# Patient Record
Sex: Female | Born: 1935 | Race: White | Hispanic: No | Marital: Married | Smoking: Never smoker
Health system: Southern US, Community
[De-identification: ages and names within clinical notes are randomized; demographics above are authoritative.]

## PROBLEM LIST (undated history)

## (undated) DIAGNOSIS — M1990 Unspecified osteoarthritis, unspecified site: Secondary | ICD-10-CM

## (undated) DIAGNOSIS — K2100 Gastro-esophageal reflux disease with esophagitis, without bleeding: Secondary | ICD-10-CM

## (undated) DIAGNOSIS — M719 Bursopathy, unspecified: Secondary | ICD-10-CM

## (undated) DIAGNOSIS — F419 Anxiety disorder, unspecified: Secondary | ICD-10-CM

## (undated) DIAGNOSIS — J45909 Unspecified asthma, uncomplicated: Secondary | ICD-10-CM

## (undated) DIAGNOSIS — F39 Unspecified mood [affective] disorder: Secondary | ICD-10-CM

## (undated) DIAGNOSIS — E039 Hypothyroidism, unspecified: Secondary | ICD-10-CM

## (undated) DIAGNOSIS — M2550 Pain in unspecified joint: Secondary | ICD-10-CM

## (undated) DIAGNOSIS — I739 Peripheral vascular disease, unspecified: Secondary | ICD-10-CM

## (undated) DIAGNOSIS — K219 Gastro-esophageal reflux disease without esophagitis: Secondary | ICD-10-CM

## (undated) DIAGNOSIS — N189 Chronic kidney disease, unspecified: Secondary | ICD-10-CM

## (undated) DIAGNOSIS — F03918 Unspecified dementia, unspecified severity, with other behavioral disturbance: Secondary | ICD-10-CM

## (undated) DIAGNOSIS — I10 Essential (primary) hypertension: Secondary | ICD-10-CM

## (undated) DIAGNOSIS — M545 Low back pain, unspecified: Secondary | ICD-10-CM

## (undated) DIAGNOSIS — F251 Schizoaffective disorder, depressive type: Secondary | ICD-10-CM

## (undated) DIAGNOSIS — J449 Chronic obstructive pulmonary disease, unspecified: Secondary | ICD-10-CM

## (undated) DIAGNOSIS — F329 Major depressive disorder, single episode, unspecified: Secondary | ICD-10-CM

## (undated) DIAGNOSIS — E785 Hyperlipidemia, unspecified: Secondary | ICD-10-CM

## (undated) DIAGNOSIS — F3289 Other specified depressive episodes: Secondary | ICD-10-CM

## (undated) DIAGNOSIS — E559 Vitamin D deficiency, unspecified: Secondary | ICD-10-CM

## (undated) HISTORY — DX: Pain in unspecified joint: M25.50

## (undated) HISTORY — DX: Vitamin D deficiency, unspecified: E55.9

## (undated) HISTORY — DX: Morbid (severe) obesity due to excess calories: E66.01

## (undated) HISTORY — PX: CHOLECYSTECTOMY: SHX55

## (undated) HISTORY — DX: Bursopathy, unspecified: M71.9

## (undated) HISTORY — DX: Low back pain, unspecified: M54.50

## (undated) HISTORY — DX: Other specified depressive episodes: F32.89

## (undated) HISTORY — DX: Unspecified osteoarthritis, unspecified site: M19.90

## (undated) HISTORY — DX: Hyperlipidemia, unspecified: E78.5

## (undated) HISTORY — DX: Hypothyroidism, unspecified: E03.9

## (undated) HISTORY — PX: BACK SURGERY: SHX140

## (undated) HISTORY — DX: Gastro-esophageal reflux disease with esophagitis, without bleeding: K21.00

## (undated) HISTORY — PX: OTHER SURGICAL HISTORY: SHX169

## (undated) HISTORY — DX: Essential (primary) hypertension: I10

## (undated) HISTORY — DX: Low back pain: M54.5

## (undated) HISTORY — PX: ABDOMINAL HYSTERECTOMY: SUR658

## (undated) HISTORY — DX: Gastro-esophageal reflux disease with esophagitis: K21.0

## (undated) HISTORY — DX: Major depressive disorder, single episode, unspecified: F32.9

---

## 2001-08-24 ENCOUNTER — Ambulatory Visit (HOSPITAL_COMMUNITY): Admission: RE | Admit: 2001-08-24 | Discharge: 2001-08-24 | Payer: Self-pay | Admitting: Pulmonary Disease

## 2002-10-11 ENCOUNTER — Ambulatory Visit (HOSPITAL_COMMUNITY): Admission: RE | Admit: 2002-10-11 | Discharge: 2002-10-11 | Payer: Self-pay | Admitting: Pulmonary Disease

## 2003-11-03 ENCOUNTER — Ambulatory Visit (HOSPITAL_COMMUNITY): Admission: RE | Admit: 2003-11-03 | Discharge: 2003-11-03 | Payer: Self-pay | Admitting: Pulmonary Disease

## 2004-01-18 ENCOUNTER — Ambulatory Visit (HOSPITAL_COMMUNITY): Admission: RE | Admit: 2004-01-18 | Discharge: 2004-01-18 | Payer: Self-pay | Admitting: Internal Medicine

## 2004-08-22 ENCOUNTER — Ambulatory Visit: Payer: Self-pay | Admitting: Orthopedic Surgery

## 2004-11-04 ENCOUNTER — Ambulatory Visit (HOSPITAL_COMMUNITY): Admission: RE | Admit: 2004-11-04 | Discharge: 2004-11-04 | Payer: Self-pay | Admitting: Pulmonary Disease

## 2005-05-06 ENCOUNTER — Ambulatory Visit (HOSPITAL_COMMUNITY): Admission: RE | Admit: 2005-05-06 | Discharge: 2005-05-06 | Payer: Self-pay | Admitting: Pulmonary Disease

## 2005-11-06 ENCOUNTER — Ambulatory Visit (HOSPITAL_COMMUNITY): Admission: RE | Admit: 2005-11-06 | Discharge: 2005-11-06 | Payer: Self-pay | Admitting: Pulmonary Disease

## 2006-11-09 ENCOUNTER — Ambulatory Visit (HOSPITAL_COMMUNITY): Admission: RE | Admit: 2006-11-09 | Discharge: 2006-11-09 | Payer: Self-pay | Admitting: Pulmonary Disease

## 2007-11-11 ENCOUNTER — Ambulatory Visit (HOSPITAL_COMMUNITY): Admission: RE | Admit: 2007-11-11 | Discharge: 2007-11-11 | Payer: Self-pay | Admitting: Pulmonary Disease

## 2008-11-13 ENCOUNTER — Ambulatory Visit (HOSPITAL_COMMUNITY): Admission: RE | Admit: 2008-11-13 | Discharge: 2008-11-13 | Payer: Self-pay | Admitting: Pulmonary Disease

## 2009-08-26 DEATH — deceased

## 2009-09-28 ENCOUNTER — Ambulatory Visit (HOSPITAL_COMMUNITY): Admission: RE | Admit: 2009-09-28 | Discharge: 2009-09-28 | Payer: Self-pay | Admitting: Pulmonary Disease

## 2009-09-28 ENCOUNTER — Ambulatory Visit: Payer: Self-pay | Admitting: Cardiology

## 2009-10-18 ENCOUNTER — Ambulatory Visit (HOSPITAL_COMMUNITY): Admission: RE | Admit: 2009-10-18 | Discharge: 2009-10-18 | Payer: Self-pay | Admitting: Pulmonary Disease

## 2009-11-15 ENCOUNTER — Ambulatory Visit (HOSPITAL_COMMUNITY): Admission: RE | Admit: 2009-11-15 | Discharge: 2009-11-15 | Payer: Self-pay | Admitting: Pulmonary Disease

## 2010-09-13 NOTE — Op Note (Signed)
NAME:  Cassie Hanson, Cassie Hanson                 ACCOUNT NO.:  [id number]   MEDICAL RECORD NO.:  [id number]          PATIENT TYPE:  AMB   LOCATION:  DAY                           FACILITY:  [facility]   PHYSICIAN:  R. Roetta Sessions, M.D. DATE OF BIRTH:  May 18, 1935   DATE OF PROCEDURE:  01/18/2004  DATE OF DISCHARGE:                                 OPERATIVE REPORT   PROCEDURE:  Screening colonoscopy.   INDICATIONS FOR PROCEDURE:  The patient is a 75 year old Caucasian female  who was sent over by the courtesy of Dr. Shaune Pollack for colonoscopy.  She  does not have any GI symptoms.  No family history of colorectal neoplasia.  She has never had a colonoscopy previously.  Colonoscopy is now being done  as a screening maneuver.  This approach has been discussed with the patient  at length.  The potential risks, benefits, and alternatives have been  reviewed and questions answered.  She is agreeable.  Please see my  handwritten H&P for more information.   PROCEDURE:  O2 saturation, blood pressure, pulses, and respirations were  monitored throughout the entirety of the procedure.  Conscious sedation was  with Versed 4 mg IV, Demerol 75 mg IV in divided doses.  The instrument used  was the Olympus video chip system.   FINDINGS:  Digital rectal examination revealed no abnormalities.   ENDOSCOPIC FINDINGS:  The prep was adequate.   Rectum:  Examination of the rectal mucosa including retroflex view of the  anal verge revealed no abnormalities.   Colon:  The colonic mucosa was surveyed from the rectosigmoid junction  through the left, transverse, right colon to the area of the appendiceal  orifice, ileocecal valve, and cecum.  These structures were well-seen and  photographed for the record.  From this level, the scope was slowly  withdrawn.  All previously mentioned mucosal surfaces were again seen.  The  colonic mucosa appeared normal.  The patient tolerated the procedure well  and was reactive in  endoscopy.   IMPRESSION:  1.  Normal rectum.  2.  Normal colon.   RECOMMENDATIONS:  Repeat screening colonoscopy in 10 years.      RMR/MEDQ  D:  01/18/2004  T:  01/18/2004  Job:  [id number]   cc:   Ramon Dredge L. Juanetta Gosling, M.D.  [address]  [place]  [place] [address]  Fax: [phone]

## 2010-10-14 ENCOUNTER — Other Ambulatory Visit (HOSPITAL_COMMUNITY): Payer: Self-pay | Admitting: Pulmonary Disease

## 2010-10-14 DIAGNOSIS — Z139 Encounter for screening, unspecified: Secondary | ICD-10-CM

## 2010-11-18 ENCOUNTER — Ambulatory Visit (HOSPITAL_COMMUNITY)
Admission: RE | Admit: 2010-11-18 | Discharge: 2010-11-18 | Disposition: A | Discharge status: Home / Self Care | Payer: Medicare Other | Source: Ambulatory Visit | Attending: Pulmonary Disease | Admitting: Pulmonary Disease

## 2010-11-18 DIAGNOSIS — Z1231 Encounter for screening mammogram for malignant neoplasm of breast: Secondary | ICD-10-CM | POA: Insufficient documentation

## 2010-11-18 DIAGNOSIS — Z139 Encounter for screening, unspecified: Secondary | ICD-10-CM

## 2011-10-13 ENCOUNTER — Other Ambulatory Visit (HOSPITAL_COMMUNITY): Payer: Self-pay | Admitting: Pulmonary Disease

## 2011-10-13 DIAGNOSIS — IMO0001 Reserved for inherently not codable concepts without codable children: Secondary | ICD-10-CM

## 2011-11-19 ENCOUNTER — Ambulatory Visit (HOSPITAL_COMMUNITY): Payer: Medicare Other

## 2011-11-20 ENCOUNTER — Ambulatory Visit (HOSPITAL_COMMUNITY)
Admission: RE | Admit: 2011-11-20 | Discharge: 2011-11-20 | Disposition: A | Discharge status: Home / Self Care | Payer: Medicare Other | Source: Ambulatory Visit | Attending: Pulmonary Disease | Admitting: Pulmonary Disease

## 2011-11-20 DIAGNOSIS — IMO0001 Reserved for inherently not codable concepts without codable children: Secondary | ICD-10-CM

## 2011-11-20 DIAGNOSIS — Z1231 Encounter for screening mammogram for malignant neoplasm of breast: Secondary | ICD-10-CM | POA: Insufficient documentation

## 2011-11-26 ENCOUNTER — Other Ambulatory Visit: Payer: Self-pay | Admitting: Pulmonary Disease

## 2011-11-26 DIAGNOSIS — R928 Other abnormal and inconclusive findings on diagnostic imaging of breast: Secondary | ICD-10-CM

## 2011-12-03 ENCOUNTER — Ambulatory Visit (HOSPITAL_COMMUNITY)
Admission: RE | Admit: 2011-12-03 | Discharge: 2011-12-03 | Disposition: A | Discharge status: Home / Self Care | Payer: Medicare Other | Source: Ambulatory Visit | Attending: Pulmonary Disease | Admitting: Pulmonary Disease

## 2011-12-03 DIAGNOSIS — R928 Other abnormal and inconclusive findings on diagnostic imaging of breast: Secondary | ICD-10-CM | POA: Insufficient documentation

## 2011-12-23 ENCOUNTER — Ambulatory Visit (HOSPITAL_COMMUNITY)
Admission: RE | Admit: 2011-12-23 | Discharge: 2011-12-23 | Disposition: A | Discharge status: Home / Self Care | Payer: Medicare Other | Source: Ambulatory Visit | Attending: Pulmonary Disease | Admitting: Pulmonary Disease

## 2011-12-23 ENCOUNTER — Other Ambulatory Visit (HOSPITAL_COMMUNITY): Payer: Self-pay | Admitting: Pulmonary Disease

## 2011-12-23 DIAGNOSIS — R52 Pain, unspecified: Secondary | ICD-10-CM

## 2011-12-23 DIAGNOSIS — M25559 Pain in unspecified hip: Secondary | ICD-10-CM | POA: Insufficient documentation

## 2012-04-01 ENCOUNTER — Other Ambulatory Visit (HOSPITAL_COMMUNITY): Payer: Self-pay | Admitting: Pulmonary Disease

## 2012-04-01 DIAGNOSIS — S0990XS Unspecified injury of head, sequela: Secondary | ICD-10-CM

## 2012-04-01 DIAGNOSIS — G44309 Post-traumatic headache, unspecified, not intractable: Secondary | ICD-10-CM

## 2012-04-02 ENCOUNTER — Ambulatory Visit (HOSPITAL_COMMUNITY)
Admission: RE | Admit: 2012-04-02 | Discharge: 2012-04-02 | Disposition: A | Discharge status: Home / Self Care | Payer: Medicare Other | Source: Ambulatory Visit | Attending: Pulmonary Disease | Admitting: Pulmonary Disease

## 2012-04-02 DIAGNOSIS — G44309 Post-traumatic headache, unspecified, not intractable: Secondary | ICD-10-CM | POA: Insufficient documentation

## 2012-04-02 DIAGNOSIS — S0990XS Unspecified injury of head, sequela: Secondary | ICD-10-CM

## 2012-04-02 DIAGNOSIS — IMO0001 Reserved for inherently not codable concepts without codable children: Secondary | ICD-10-CM | POA: Insufficient documentation

## 2012-10-26 ENCOUNTER — Other Ambulatory Visit (HOSPITAL_COMMUNITY): Payer: Self-pay | Admitting: Pulmonary Disease

## 2012-10-26 DIAGNOSIS — Z139 Encounter for screening, unspecified: Secondary | ICD-10-CM

## 2012-12-06 ENCOUNTER — Ambulatory Visit (HOSPITAL_COMMUNITY)
Admission: RE | Admit: 2012-12-06 | Discharge: 2012-12-06 | Disposition: A | Discharge status: Home / Self Care | Payer: Medicare HMO | Source: Ambulatory Visit | Attending: Pulmonary Disease | Admitting: Pulmonary Disease

## 2012-12-06 DIAGNOSIS — Z1231 Encounter for screening mammogram for malignant neoplasm of breast: Secondary | ICD-10-CM | POA: Insufficient documentation

## 2012-12-06 DIAGNOSIS — Z139 Encounter for screening, unspecified: Secondary | ICD-10-CM

## 2013-04-11 IMAGING — CR DG HIP COMPLETE 2+V*R*
2 series · 2 of 2 positions shown · non-contrast
Comparison: None.

CLINICAL DATA: Fall 1 month ago.  Pain.

RIGHT HIP - COMPLETE 2+ VIEW

[view not recorded (1 of 2)]
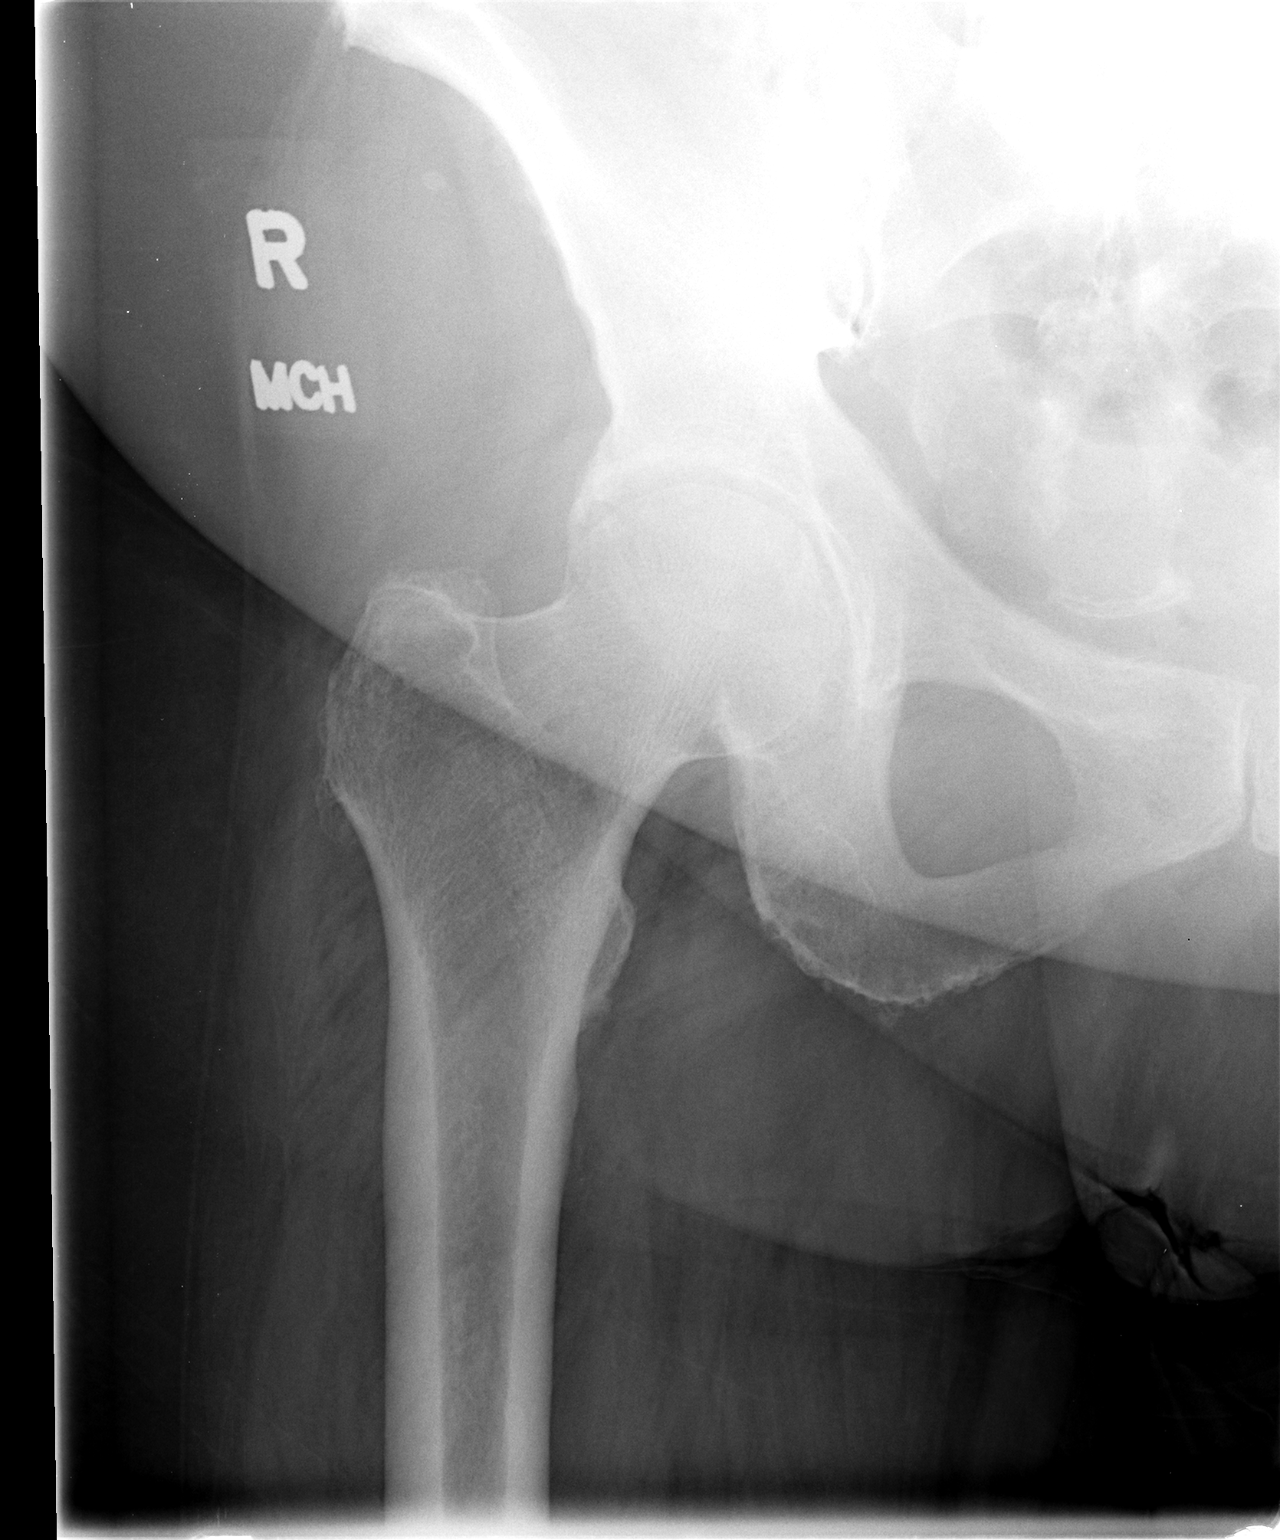

[view not recorded (2 of 2)]
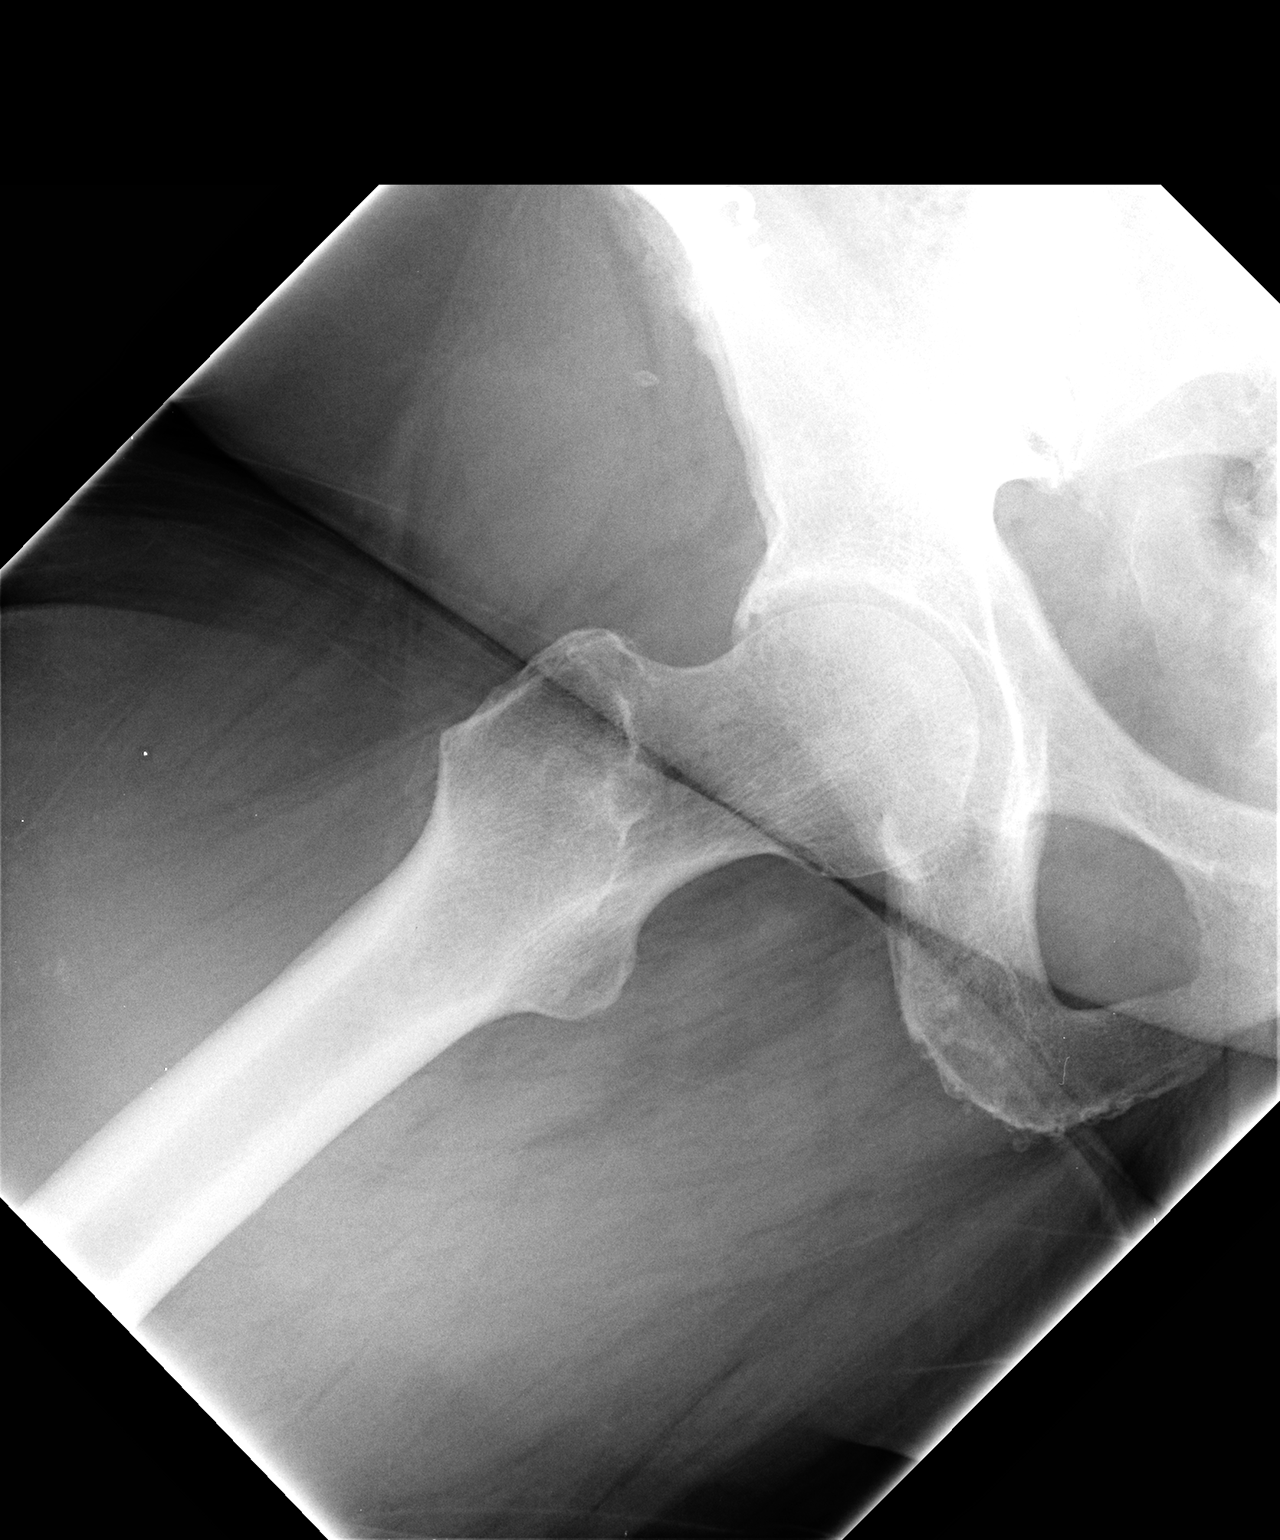

[2 of 2 positions shown; findings below may reference images not displayed]

FINDINGS: No fracture or bone lesion.  The hip joint is normally
spaced and aligned without degenerative change.  The visualized
portion of the right hemi pelvis shows no fractures.  The right SI
joint is normally aligned.  Unremarkable soft tissues.
IMPRESSION: No fracture or right hip joint abnormality.

## 2013-04-11 IMAGING — CR DG PELVIS 1-2V
1 series · 1 of 1 positions shown · non-contrast
Comparison: Abdomen and pelvis CT, 10/18/2009

CLINICAL DATA: Pain

PELVIS - 1-2 VIEW

[view not recorded]
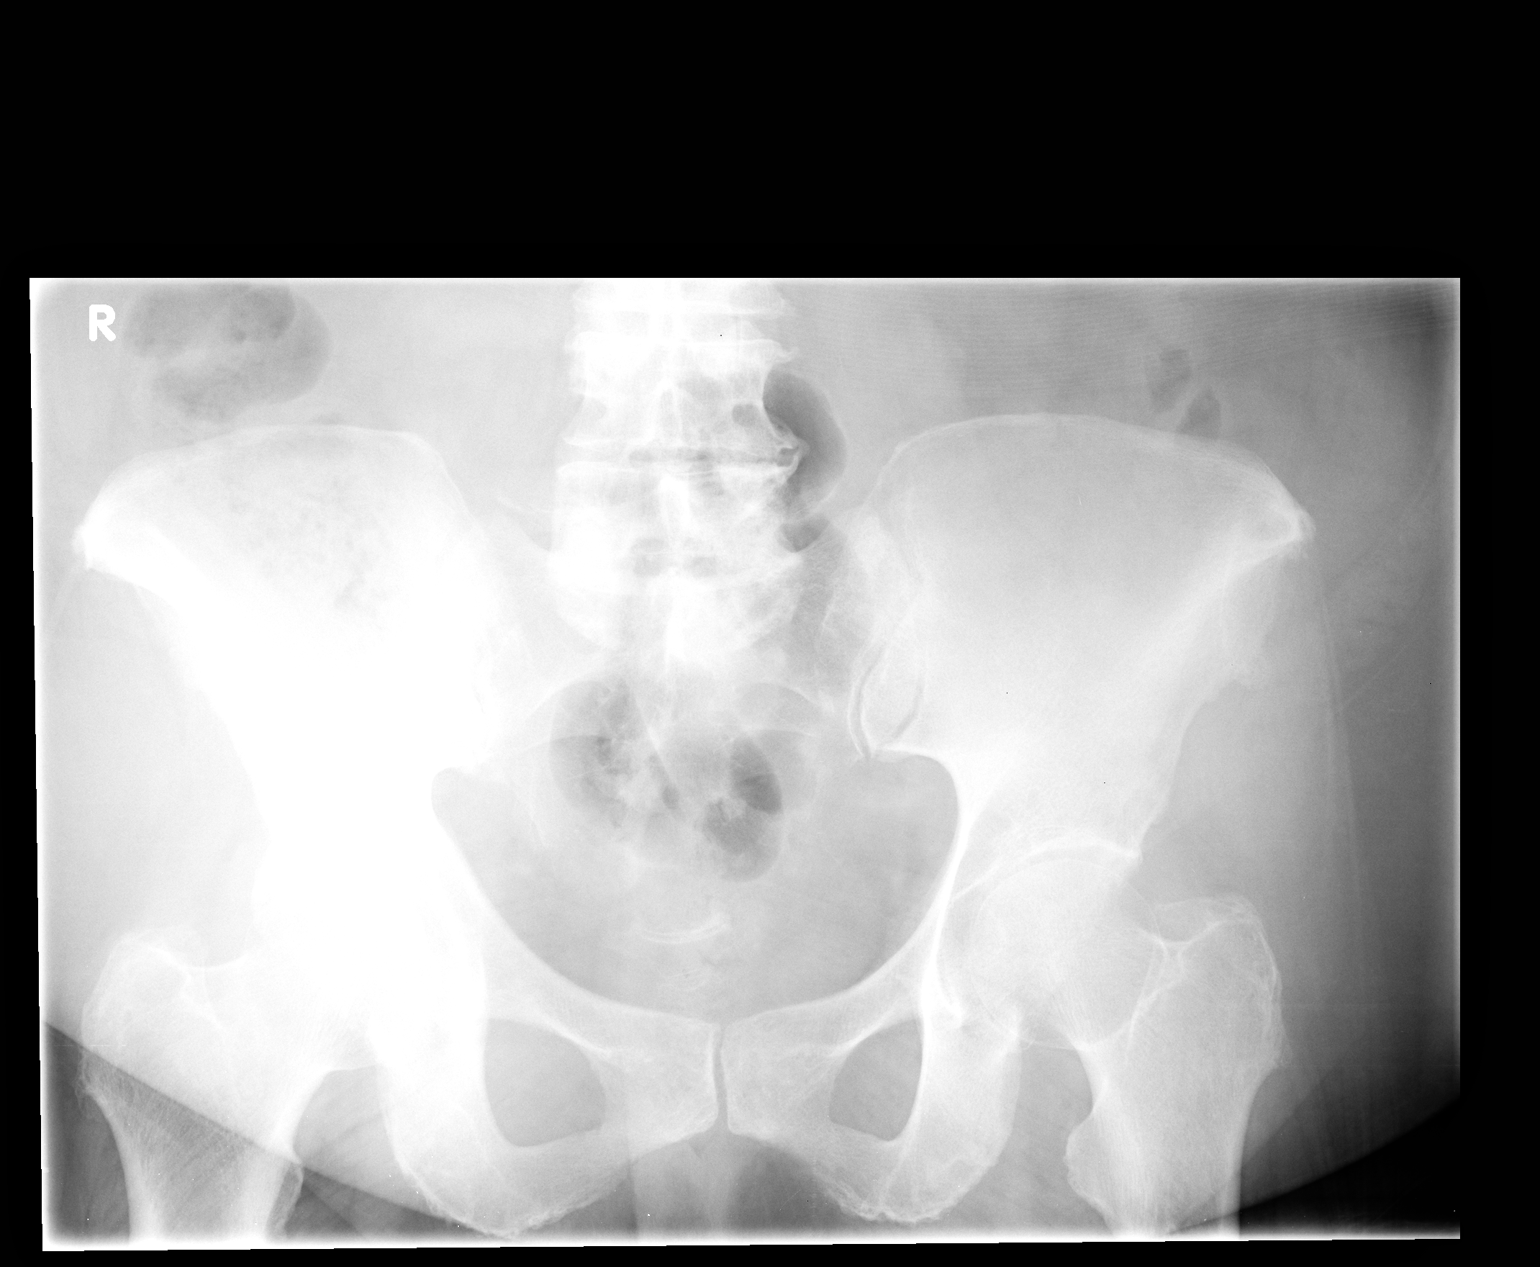

[1 of 1 positions shown; findings below may reference images not displayed]

FINDINGS: No fracture or bone lesion.  The hip joints are normally
spaced and aligned as are the SI joints and symphysis pubis.  There
is moderate loss of disc height with endplate osteophytes at L4-L5.
The surrounding soft tissues are unremarkable.
IMPRESSION: No fracture or bone lesion.  Joints are normally spaced and
aligned.

## 2013-11-04 ENCOUNTER — Other Ambulatory Visit (HOSPITAL_COMMUNITY): Payer: Self-pay | Admitting: Pulmonary Disease

## 2013-11-04 DIAGNOSIS — Z1231 Encounter for screening mammogram for malignant neoplasm of breast: Secondary | ICD-10-CM

## 2013-11-17 ENCOUNTER — Ambulatory Visit (INDEPENDENT_AMBULATORY_CARE_PROVIDER_SITE_OTHER): Payer: Medicare HMO | Admitting: Otolaryngology

## 2013-11-17 DIAGNOSIS — K219 Gastro-esophageal reflux disease without esophagitis: Secondary | ICD-10-CM

## 2013-11-17 DIAGNOSIS — R1312 Dysphagia, oropharyngeal phase: Secondary | ICD-10-CM

## 2013-11-17 DIAGNOSIS — R490 Dysphonia: Secondary | ICD-10-CM

## 2013-12-08 ENCOUNTER — Ambulatory Visit (HOSPITAL_COMMUNITY): Payer: Medicare Other

## 2013-12-13 ENCOUNTER — Ambulatory Visit (HOSPITAL_COMMUNITY): Payer: Medicare Other

## 2013-12-14 ENCOUNTER — Ambulatory Visit (HOSPITAL_COMMUNITY)
Admission: RE | Admit: 2013-12-14 | Discharge: 2013-12-14 | Disposition: A | Discharge status: Home / Self Care | Payer: Medicare HMO | Source: Ambulatory Visit | Attending: Pulmonary Disease | Admitting: Pulmonary Disease

## 2013-12-14 DIAGNOSIS — Z1231 Encounter for screening mammogram for malignant neoplasm of breast: Secondary | ICD-10-CM | POA: Insufficient documentation

## 2013-12-15 ENCOUNTER — Encounter (INDEPENDENT_AMBULATORY_CARE_PROVIDER_SITE_OTHER): Payer: Self-pay | Admitting: *Deleted

## 2013-12-15 ENCOUNTER — Ambulatory Visit (INDEPENDENT_AMBULATORY_CARE_PROVIDER_SITE_OTHER): Payer: Medicare HMO | Admitting: Otolaryngology

## 2013-12-15 DIAGNOSIS — R070 Pain in throat: Secondary | ICD-10-CM

## 2013-12-28 ENCOUNTER — Encounter: Payer: Self-pay | Admitting: *Deleted

## 2013-12-29 ENCOUNTER — Ambulatory Visit (INDEPENDENT_AMBULATORY_CARE_PROVIDER_SITE_OTHER): Payer: Medicare HMO | Admitting: Neurology

## 2013-12-29 ENCOUNTER — Encounter: Payer: Self-pay | Admitting: Neurology

## 2013-12-29 ENCOUNTER — Ambulatory Visit: Payer: Medicare HMO | Admitting: Neurology

## 2013-12-29 VITALS — BP 160/66 | HR 87 | Ht 62.0 in | Wt 207.0 lb

## 2013-12-29 DIAGNOSIS — R825 Elevated urine levels of drugs, medicaments and biological substances: Secondary | ICD-10-CM

## 2013-12-29 DIAGNOSIS — R4689 Other symptoms and signs involving appearance and behavior: Principal | ICD-10-CM

## 2013-12-29 DIAGNOSIS — R4189 Other symptoms and signs involving cognitive functions and awareness: Secondary | ICD-10-CM

## 2013-12-29 DIAGNOSIS — G609 Hereditary and idiopathic neuropathy, unspecified: Secondary | ICD-10-CM

## 2013-12-29 DIAGNOSIS — F919 Conduct disorder, unspecified: Secondary | ICD-10-CM

## 2013-12-29 DIAGNOSIS — R892 Abnormal level of other drugs, medicaments and biological substances in specimens from other organs, systems and tissues: Secondary | ICD-10-CM

## 2013-12-29 NOTE — Progress Notes (Addendum)
[facility]    Provider:  Dr Jaynee Eagles Referring Provider: Alonza Bogus, MD Primary Care Physician:  Alonza Bogus, MD  CC:  Cognitive dysfunction  HPI:  Cassie Hanson is a 78 y.o. female here as a referral from Dr. Luan Pulling for cognitive dysfunction  Patient is a lovely 78 year old female who is here with her 2 sisters. Patient is worried about the medications she is being given at home. Sisters feel that patient is being given inappropriate medication at home too. It all started over a year ago when patient was given very strong pain medications after a dental procedure. She was on the medication three times a day. Sisters noticed that the patient was odd on the phone at that time; She seemed confused.  Sisters took patient to the doctor and complained about the strong pain meds The sisters complained to the son and daughter-in-law as well and told them to stop giving patient strong pain medications and went as far as throwing medications in the trash. The patient improved after that for a while but the sisters have noticed her acting in a similar way over the last year.    Patient lives with son and daughter in law.  Patient doesn't know what medications she takes. Patient is given medications at home by her daughter-in-law and son and her meds are also placed into her pill manager for her. Patient and sisters want to know if this is really dementia or if patient is being given narcotics or other medications without her knowledge, to make her confused, possibly a urine or blood drug test would shed some light. Patient wants to know exactly what she is taking. Patient says she is not in pain so does not know why she would be taking any pain medication. She has a recent sore throat but denies back pain, denies joint pain or any other pain at all.   Patient says she feels confused/foggy and her mouth is dry all the time. Patient's sister gives an example that one day the  patient didn't know where she was or if her husband was there. Patient spends 2 days a week with her sister. The sister sometimes tells the patient a plan multiple times, like when to be there at her house, and the patient still forgots. No hallucinations or delusions, or personality changes. Patient sometimes stares off in confusion. Patient denies depression and does not want to be diagnosed with dementia, she denies memory issues and says she is just busy and goes to a lot of places and has a lot of things going on and has a lot of current stressors. Patient's husband is in a nursing home, patient feels like she is sad most days, she doesn't drive, doesn't have a car, doesn't live at home anymore which saddens her. She still likes going out and being with people. Denies loss of concentration. Tries to go the nursing home every day to visit husband. Denies excessive drowsiness. Denies being sleepy. No significant weight changes. Patient can remember old things like the address from where she lived as a child. Remembers what she had for lunch yesterday. She is always smacking her lips because she is dry in her mouth. Patient can dial phone numbers, feed and dress herself. No recent trauma to the head. No stroke-like symptoms but sisters feel her eyes look droopy like she is impaired.   Patient does not want her son or sister-in-law to know she was here in the office  seeing a doctor and does not want them to have access to her medical records or information. She does not want me to call and ask them what medications she is being given in the home. Reverse lookup shows that in the pill box today is sertraline, aricept and omeprazole.    Reviewed notes, labs and imaging from outside physicians, which showed Vicodin was prescribed in 2013 with 5 refills by Dr. Sinda Du, Hydroxyzine was prescribed in 2014 and she also has reglan, sertraline, aricept, norco, simvastatin, proair on her [facility] doc which was printed  12/14/2013 and sent to our office, unclear if these are still valid prescriptions. Personally reviewed CT of the head images, atrophy and ventricle size appropriate for age. No significant white matter disease or large ischemic infarct.   Review of Systems: Patient complains of symptoms per HPI as well as the following symptoms memory loss, confusion, headache, dizziness, increased thirst, diarrhea, aching muscles, allergies, runny nose. Pertinent negatives per HPI. Otherwise out of a complete 14 system review, and all other reviewed systems are negative.   History   Social History  . Marital Status: Married    Spouse Name: N/A    Number of Children: 1  . Years of Education: 12   Occupational History  . Not on file.   Social History Main Topics  . Smoking status: Never Smoker   . Smokeless tobacco: Never Used  . Alcohol Use: No  . Drug Use: No  . Sexual Activity: Not on file   Other Topics Concern  . Not on file   Social History Narrative   Patient is married with one child.   Patient is right handed.   Patient has 12 th grade education.          Family History  Problem Relation Age of Onset  . Other      ARTHROPATHY, UNSPECIFIED SITE  . Hypertension    . Stroke Father   . Diabetes    . Cancer Mother     BREAST    Past Medical History  Diagnosis Date  . Bursitis disorder   . Osteoarthrosis, unspecified whether generalized or localized, unspecified site   . Pain in joint     INVOLVING LOWER LEG  . Reflux esophagitis   . Lumbago   . Other and unspecified hyperlipidemia   . Unspecified hypothyroidism   . Unspecified vitamin D deficiency   . Depressive disorder, not elsewhere classified   . Morbid obesity   . Hypertension     Past Surgical History  Procedure Laterality Date  . Abdominal hysterectomy    . Cholecystectomy    . Back surgery    . Unlisted procedure      FOREARM/WRIST    Current Outpatient Prescriptions  Medication Sig Dispense Refill  .  donepezil (ARICEPT) 10 MG tablet Take 10 mg by mouth at bedtime.       . fluticasone (FLONASE) 50 MCG/ACT nasal spray Place 1 spray into both nostrils daily.       Marland Kitchen ibuprofen (ADVIL,MOTRIN) 800 MG tablet Take 800 mg by mouth every 8 (eight) hours as needed.       Marland Kitchen ipratropium (ATROVENT) 0.06 % nasal spray Place 1 spray into both nostrils 3 (three) times daily.       Marland Kitchen levothyroxine (SYNTHROID, LEVOTHROID) 75 MCG tablet Take 75 mcg by mouth daily before breakfast.       . omeprazole (PRILOSEC) 20 MG capsule Take 20 mg by mouth daily.       Marland Kitchen  PROAIR HFA 108 (90 BASE) MCG/ACT inhaler Inhale 1 puff into the lungs every 4 (four) hours as needed.       . sertraline (ZOLOFT) 100 MG tablet Take 100 mg by mouth at bedtime.        No current facility-administered medications for this visit.    Allergies as of 12/29/2013 - Review Complete 12/29/2013  Allergen Reaction Noted  . Keflex [cephalexin]  12/28/2013    Vitals: BP 160/66  Pulse 87  Ht 5\' 2"  (1.575 m)  Wt 207 lb (93.895 kg)  BMI 37.85 kg/m2 Last Weight:  Wt Readings from Last 1 Encounters:  12/29/13 207 lb (93.895 kg)   Last Height:   Ht Readings from Last 1 Encounters:  12/29/13 5\' 2"  (1.575 m)     Physical exam: Exam: Gen: NAD, conversant Eyes: anicteric sclerae, moist conjunctivae HENT: Atraumatic, oropharynx clear. Patient is persistently smacking her mouth, says her mouth is very dry. Neck: Trachea midline; supple,  Lungs: CTA, no wheezing, rales, rhonic                          CV: RRR, no MRG Abdomen: Soft, non-tender;  Skin: Normal temperature, no rash,  Psych: flat affect, cooperative  Neuro: Detailed Neurologic Exam:    Speech:    No dysarthria Cognition:   18/30 MOCA  Cranial Nerves:    The pupils are equal, round, and reactive to light. The fundi are normal and spontaneous venous pulsations are present. Visual fields are full to finger confrontation. Mildly impaired upgaze otherwise extraocular  movements are intact. Trigeminal sensation is intact and the muscles of mastication are normal. The face is symmetric. The palate elevates in the midline. Voice is normal. Shoulder shrug is normal. The tongue has normal motion without fasciculations.   Coordination:  FTN without dysmetria  Gait:    Unable to perform heel to toe, impaired balance  Motor Observation:    Patient with persistent mouth and tongue movements, says her mouth is dry Tone:    Normal muscle tone.    Posture:    Posture is normal. normal erect    Strength:    Mild hip flexion weakness otherwise strength is V/V in the upper and lower limbs.  Proprioception:    Positive romberg  Pin Prick:    Decreased in the distal lower extremities   Reflex Exam: No frontal release signs  DTR's:    Absent achilles reflexes. Otherwise deep tendon reflexes in the upper and lower extremities are brisk. Toes:    The toes are downgoing bilaterally.   Clonus:    Clonus is absent.     Assessment/Plan:  78 year old year old female, accompanied by her 2 sisters, who is here for evaluation of cognitive  changes. She lives with son and daughter-in-law. Cognitive changes started about a year ago and in the past patient has acted in a similar manner after being prescribed pain medication after a dental procedure. Patient Says she feels confused and foggy. Sisters and patient are concerned that patient has been receiving medications at home without her knowledge, such as pain medication, that are making her confused. Patient does not know what the medications that she is being given because her son and his wife manage medication administration. Patient denies any pain so does not know why she would be given any pain medications at all and states she does not want them. She complains of dry mouth, confusion/fogginess as well as  balance difficulties. Patient's husband is in the nursing home and she has a lot of stressors in her life but  denies dementia. Patient denies that she is in any pain and states she has not taken pain medications of her own will. Urine test today in the office is positive for Fentanyl.  - Will call patient's physicians on file and discuss patient's current medications - If there is any indication of elder abuse, it will be reported to the appropriate authorities - CT of the brain was unremarkable. Will order an FDG-PET scan to evaluate for abnormal glucose metabolism indicative of Alzheimer's-type dementia - Will order dementia lab screening and urine drug screen - Has some decreased pin prick and + romberg on exam. May have peripheral neuropathy contributing to imbalance. Will perform a neuropathy lab screen. - Patient does not want her son and daughter-in-law informed of any medical information. Patient has requested her medical information be secured since daughter-in-law is a Herbalist - Provided patient with a list of medications that can cause cognitive and behavioral changes in the elderly such as narcotics, some antidepressants such as TCAs, Anxiolytics, antihistamines. - It is possible that patient's complaints have been caused or at least are contributed to by medications side effects especially given drug screen with +Fentanyl in the office today  Addendum 01/03/2014: Spoke to referring physician and primary care doctor Alonza Bogus, MD and informed him of the concerns with patient. He did not know of any fentanyl prescription given to patient. Also called and spoke to Donneta Romberg at [facility] [phone] and made a report.   Sarina Ill, MD  [facility] [address] [place], [place] [address]  Phone [phone] Fax [phone]

## 2013-12-29 NOTE — Patient Instructions (Signed)
Remember to drink plenty of fluid, eat healthy meals and do not skip any meals. Try to eat protein with a every meal and eat a healthy snack such as fruit or nuts in between meals. Try to keep a regular sleep-wake schedule and try to exercise daily, particularly in the form of walking, 20-30 minutes a day, if you can.   As far as your medications are concerned, I would like to suggest: I have provided a list of medications that may cause confusion such as narcotics, antihistamines, anxiolytics  As far as diagnostic testing: PET SCan: fluorodeoxyglucose (FDG)-PET   I would like to see you back in 3 months, sooner if we need to. Please call us with any interim questions, concerns, problems, updates or refill requests.   Please also call us for any test results so we can go over those with you on the phone.  My clinical assistant and will answer any of your questions and relay your messages to me and also relay most of my messages to you.   Our phone number is [phone]. We also have an after hours call service for urgent matters and there is a physician on-call for urgent questions. For any emergencies you know to call 911 or go to the nearest emergency room

## 2013-12-30 LAB — PAIN MGT SCRN (14 DRUGS), UR
Amphetamine Screen, Ur: NEGATIVE ng/mL
Barbiturate Screen, Ur: NEGATIVE ng/mL
Benzodiazepine Screen, Urine: NEGATIVE ng/mL
Buprenorphine, Urine: NEGATIVE ng/mL
Cannabinoids Ur Ql Scn: NEGATIVE ng/mL
Cocaine(Metab.)Screen, Urine: NEGATIVE ng/mL
Creatinine(Crt), U: 133.5 mg/dL (ref 20.0–300.0)
Fentanyl+Norfentanyl Ur Ql Scn: POSITIVE pg/mL
Meperidine Ur Ql: NEGATIVE ng/mL
Methadone Scn, Ur: NEGATIVE ng/mL
Opiate Scrn, Ur: NEGATIVE ng/mL
Oxycodone+Oxymorphone Ur Ql Scn: NEGATIVE ng/mL
PCP Scrn, Ur: NEGATIVE ng/mL
Ph of Urine: 5.4 (ref 4.5–8.9)
Propoxyphene, Screen: NEGATIVE ng/mL
Tramadol Ur Ql Scn: NEGATIVE ng/mL

## 2014-01-02 LAB — IFE AND PE, SERUM
ALBUMIN SERPL ELPH-MCNC: 3.6 g/dL (ref 3.2–5.6)
ALBUMIN/GLOB SERPL: 1.2 (ref 0.7–2.0)
ALPHA 1: 0.3 g/dL (ref 0.1–0.4)
ALPHA2 GLOB SERPL ELPH-MCNC: 1 g/dL (ref 0.4–1.2)
B-GLOBULIN SERPL ELPH-MCNC: 1.1 g/dL (ref 0.6–1.3)
GAMMA GLOB SERPL ELPH-MCNC: 0.9 g/dL (ref 0.5–1.6)
Globulin, Total: 3.2 g/dL (ref 2.0–4.5)
IgA/Immunoglobulin A, Serum: 268 mg/dL (ref 91–414)
IgG (Immunoglobin G), Serum: 994 mg/dL (ref 700–1600)
IgM (Immunoglobulin M), Srm: 55 mg/dL (ref 40–230)
TOTAL PROTEIN: 6.8 g/dL (ref 6.0–8.5)

## 2014-01-03 ENCOUNTER — Telehealth: Payer: Self-pay | Admitting: Neurology

## 2014-01-03 DIAGNOSIS — R4189 Other symptoms and signs involving cognitive functions and awareness: Secondary | ICD-10-CM | POA: Insufficient documentation

## 2014-01-03 DIAGNOSIS — R825 Elevated urine levels of drugs, medicaments and biological substances: Secondary | ICD-10-CM | POA: Insufficient documentation

## 2014-01-03 DIAGNOSIS — G609 Hereditary and idiopathic neuropathy, unspecified: Secondary | ICD-10-CM | POA: Insufficient documentation

## 2014-01-03 LAB — COMPREHENSIVE METABOLIC PANEL
ALBUMIN: 4.1 g/dL (ref 3.5–4.8)
ALK PHOS: 105 IU/L (ref 39–117)
ALT: 14 IU/L (ref 0–32)
AST: 24 IU/L (ref 0–40)
Albumin/Globulin Ratio: 1.6 (ref 1.1–2.5)
BUN / CREAT RATIO: 18 (ref 11–26)
BUN: 12 mg/dL (ref 8–27)
CHLORIDE: 100 mmol/L (ref 97–108)
CO2: 24 mmol/L (ref 18–29)
Calcium: 9.2 mg/dL (ref 8.7–10.3)
Creatinine, Ser: 0.68 mg/dL (ref 0.57–1.00)
GFR calc Af Amer: 99 mL/min/{1.73_m2} (ref >59)
GFR calc non Af Amer: 86 mL/min/{1.73_m2} (ref >59)
GLUCOSE: 72 mg/dL (ref 65–99)
Globulin, Total: 2.5 g/dL (ref 1.5–4.5)
Potassium: 4.2 mmol/L (ref 3.5–5.2)
Sodium: 142 mmol/L (ref 134–144)
TOTAL PROTEIN: 6.6 g/dL (ref 6.0–8.5)
Total Bilirubin: 0.3 mg/dL (ref 0.0–1.2)

## 2014-01-03 LAB — SEDIMENTATION RATE: Sed Rate: 25 mm/hr (ref 0–40)

## 2014-01-03 LAB — HEAVY METALS, BLOOD
Arsenic: 8 ug/L (ref 2–23)
LEAD, BLOOD: NOT DETECTED ug/dL (ref 0–19)
MERCURY: NOT DETECTED ug/L (ref 0.0–14.9)

## 2014-01-03 LAB — AMMONIA: AMMONIA: 122 ug/dL — AB (ref 19–87)

## 2014-01-03 LAB — LYME, TOTAL AB TEST/REFLEX

## 2014-01-03 LAB — FOLATE: Folate: >19.9 ng/mL (ref >3.0)

## 2014-01-03 LAB — VITAMIN B12: Vitamin B-12: 1001 pg/mL — ABNORMAL HIGH (ref 211–946)

## 2014-01-03 LAB — VITAMIN B1, WHOLE BLOOD: Thiamine: 218.6 nmol/L — ABNORMAL HIGH (ref 66.5–200.0)

## 2014-01-03 LAB — METHYLMALONIC ACID, SERUM: METHYLMALONIC ACID: 190 nmol/L (ref 0–378)

## 2014-01-03 LAB — RPR: RPR: NONREACTIVE

## 2014-01-03 LAB — ANA W/REFLEX: Anti Nuclear Antibody(ANA): NEGATIVE

## 2014-01-03 LAB — TSH: TSH: 3.75 u[IU]/mL (ref 0.450–4.500)

## 2014-01-03 LAB — RHEUMATOID FACTOR: Rhuematoid fact SerPl-aCnc: 9.9 IU/mL (ref 0.0–13.9)

## 2014-01-03 NOTE — Telephone Encounter (Signed)
Spoke with patient's sister Joycelyn Schmid and explained the laboratory results. Patient had requested during her appointment that one of her sisters be the point of contact for all medical information.Advised sister that I am bound to call Adult protective services.   Spoke with Donneta Romberg [phone] [place] adult protected services and made a report.

## 2014-01-04 ENCOUNTER — Telehealth: Payer: Self-pay | Admitting: Neurology

## 2014-01-04 NOTE — Telephone Encounter (Signed)
Patient's sister called stating they want the MRI scheduled,  I explained that once it's authorized our MRI dept will call with the appointment.

## 2014-01-05 ENCOUNTER — Telehealth: Payer: Self-pay | Admitting: Neurology

## 2014-01-05 NOTE — Telephone Encounter (Signed)
Patient sister Merlene Pulling called regarding Lab results.  She stated sister Joycelyn Schmid stated she didn't get results from Dr. Jaynee Eagles.  Regino Schultze requested a return call today or tomorrow at 4:30 when she gets off work.  Her contact # [phone].

## 2014-01-09 NOTE — Telephone Encounter (Signed)
Dr Jaynee Eagles returned patient's sister Merlene Pulling back, left message that results have already been given to [place] and she has spoken with Joycelyn Schmid several times

## 2014-01-10 ENCOUNTER — Telehealth: Payer: Self-pay | Admitting: Neurology

## 2014-01-10 NOTE — Telephone Encounter (Signed)
Spoke witj gladys walker, patient's sister at length regarding testing and social services. Social services is meeting with her and margaret pruit this week. Still waiting on CT scheduling. Will follow up.

## 2014-01-12 ENCOUNTER — Other Ambulatory Visit: Payer: Self-pay | Admitting: Neurology

## 2014-01-12 DIAGNOSIS — R4189 Other symptoms and signs involving cognitive functions and awareness: Secondary | ICD-10-CM

## 2014-01-12 DIAGNOSIS — R4689 Other symptoms and signs involving appearance and behavior: Principal | ICD-10-CM

## 2014-01-13 ENCOUNTER — Telehealth: Payer: Self-pay | Admitting: Neurology

## 2014-01-13 NOTE — Telephone Encounter (Signed)
Message copied by Melvenia Beam on Fri Jan 13, 2014  3:30 PM ------      Message from: Vivi Barrack      Created: Fri Jan 13, 2014  2:27 PM       Patient sister is not happy about the CT not being approved the patient being scheduled for all these other test. She just wants to know what is going on with the patient brain and she wants you to call her. She does not want to talk to me or anyone else. Please advise. Thanks       ----- Message -----         From: Melvenia Beam, MD         Sent: 01/12/2014  12:10 PM           To: Otho Ket            Can you please call patient's sister and let her know that insurance will not approve the cat scan of patients head unless she first does a neuropsychiatric testing which takes a few hours. You can leave a message. Merlene Pulling [phone]. Please leave a detailed message on her voice mail if she is not there and then call the other sister margaret [phone]. thanks       ------

## 2014-01-13 NOTE — Telephone Encounter (Signed)
Called sister and received voice mail. Left a detailed message. I'm sorry that insurance did not approve the FDG PET Scan (CAT scan that is sensitive for dementia) but insurance said that she had to do formal neuropsychiatric testing first before they will approve. I had originally discussed neuropsychiatric testing at appointment but patient declined. So left message that we will have to complete the neuropsych testing first before the FDG PET scan or insurance will not approve. The neuropsych testing will give [place] a lot of information as well so it is a good test to complete.  If patient calls back, I'm sure she can speak to Janet(Im sure Marcie Bal would not mind) since I am out of the office. I have spoken to the 2 sisters extensively on diffierent occassions regarding the Fentanyl in the patient's urine and our next steps. I know social services is also investigating and have been to the patient's home, I have spoken to them as well.

## 2014-01-16 ENCOUNTER — Telehealth: Payer: Self-pay | Admitting: Neurology

## 2014-01-16 NOTE — Telephone Encounter (Signed)
Tillie Rung called from [facility] about pt appt for Pet Scan.  (needing [employer] approval) by 1200 today.   [phone].

## 2014-01-16 NOTE — Telephone Encounter (Signed)
Spoke to [place] a [employer] and he will cancel the appt,

## 2014-01-16 NOTE — Telephone Encounter (Signed)
Tillie Rung from [place] calling to request a call back from [place] regarding patient's PET scan, her appointment is still on the schedule, please call and advise.

## 2014-01-16 NOTE — Telephone Encounter (Signed)
Spoke to Cassie Hanson and gave her the message that Dr. Jaynee Eagles typed up about the PET scan being denied and patient needing Neruopsyc done 1st. She told me to call [phone] to cancel the patient appt so they will not be charged.

## 2014-01-17 ENCOUNTER — Encounter (HOSPITAL_COMMUNITY): Payer: Medicare HMO

## 2014-01-19 ENCOUNTER — Ambulatory Visit (INDEPENDENT_AMBULATORY_CARE_PROVIDER_SITE_OTHER): Payer: Medicare HMO | Admitting: Internal Medicine

## 2014-01-19 ENCOUNTER — Encounter (INDEPENDENT_AMBULATORY_CARE_PROVIDER_SITE_OTHER): Payer: Self-pay | Admitting: Internal Medicine

## 2014-01-19 ENCOUNTER — Telehealth (INDEPENDENT_AMBULATORY_CARE_PROVIDER_SITE_OTHER): Payer: Self-pay | Admitting: *Deleted

## 2014-01-19 ENCOUNTER — Other Ambulatory Visit (INDEPENDENT_AMBULATORY_CARE_PROVIDER_SITE_OTHER): Payer: Self-pay | Admitting: *Deleted

## 2014-01-19 VITALS — BP 134/58 | HR 72 | Temp 98.2°F | Ht 62.0 in | Wt 202.3 lb

## 2014-01-19 DIAGNOSIS — R197 Diarrhea, unspecified: Secondary | ICD-10-CM

## 2014-01-19 DIAGNOSIS — Z1211 Encounter for screening for malignant neoplasm of colon: Secondary | ICD-10-CM

## 2014-01-19 NOTE — Telephone Encounter (Signed)
Patient needs trilyte 

## 2014-01-19 NOTE — Patient Instructions (Signed)
Fiber 4 gms. Imodium one in am and one in pm.  Stool studies. Colonoscopy

## 2014-01-19 NOTE — Progress Notes (Signed)
   Subjective:    Patient ID: Cassie Hanson, female    DOB: 06-06-1935, 78 y.o.   MRN: [id number]  HPI Referred to our office by Dr. Luan Pulling for chronic diarrhea. She tells me she has diarrhea several times a week. She takes an anti-diarrheal several times a weeks. When she has diarrhea she will have 2 loose stools a day.  She has had some incontience, due to urgency. She has not been on any recent antibiotics. She has had this diarrhea off and on for 2-3 yrs. No melena or BRRB. There is no abdominal pain.  Her husband is in a nursing home. Her last colonoscopy 12/2003 Dr. Gala Romney: 1. Normal rectum.  2. Normal colon.  RECOMMENDATIONS: Repeat screening colonoscopy in 10 years.  09/2012 H and H 12.2 and 36.7, platelet 287  Review of Systems     Past Medical History  Diagnosis Date  . Bursitis disorder   . Osteoarthrosis, unspecified whether generalized or localized, unspecified site   . Pain in joint     INVOLVING LOWER LEG  . Reflux esophagitis   . Lumbago   . Other and unspecified hyperlipidemia   . Unspecified hypothyroidism   . Unspecified vitamin D deficiency   . Depressive disorder, not elsewhere classified   . Morbid obesity   . Hypertension     Past Surgical History  Procedure Laterality Date  . Abdominal hysterectomy    . Cholecystectomy    . Back surgery    . Unlisted procedure      FOREARM/WRIST    Allergies  Allergen Reactions  . Keflex [Cephalexin]     Current Outpatient Prescriptions on File Prior to Visit  Medication Sig Dispense Refill  . donepezil (ARICEPT) 10 MG tablet Take 10 mg by mouth at bedtime.       Marland Kitchen ipratropium (ATROVENT) 0.06 % nasal spray Place 1 spray into both nostrils 3 (three) times daily.       Marland Kitchen levothyroxine (SYNTHROID, LEVOTHROID) 75 MCG tablet Take 75 mcg by mouth daily before breakfast.       . omeprazole (PRILOSEC) 20 MG capsule Take 20 mg by mouth daily.       Marland Kitchen PROAIR HFA 108 (90 BASE) MCG/ACT inhaler Inhale 1 puff into the  lungs every 4 (four) hours as needed.       . sertraline (ZOLOFT) 100 MG tablet Take 100 mg by mouth at bedtime.        No current facility-administered medications on file prior to visit.     Objective:   Physical Exam  Filed Vitals:   01/19/14 1100  BP: 134/58  Pulse: 72  Temp: 98.2 F (36.8 C)  Height: 5\' 2"  (1.575 m)  Weight: 202 lb 4.8 oz (91.763 kg)  Alert and oriented. Skin warm and dry. Oral mucosa is moist.   . Sclera anicteric, conjunctivae is pink. Thyroid not enlarged. No cervical lymphadenopathy. Lungs clear. Heart regular rate and rhythm.  Abdomen is soft. Bowel sounds are positive. No hepatomegaly. No abdominal masses felt. No tenderness.  No edema to lower extremities.           Assessment & Plan:  Diarrhea chronic. No recent antibiotics. Fiber 4 gms daily. Imodium BID. Stool studies Needs screening colonoscopy.

## 2014-01-23 MED ORDER — PEG 3350-KCL-NA BICARB-NACL 420 G PO SOLR: 4000.0000 mL | Freq: Once | ORAL | Status: DC

## 2014-01-27 ENCOUNTER — Encounter (INDEPENDENT_AMBULATORY_CARE_PROVIDER_SITE_OTHER): Payer: Self-pay | Admitting: *Deleted

## 2014-03-16 ENCOUNTER — Ambulatory Visit (HOSPITAL_COMMUNITY)
Admission: RE | Admit: 2014-03-16 | Discharge: 2014-03-16 | Disposition: A | Discharge status: Home / Self Care | Payer: Medicare HMO | Source: Ambulatory Visit | Attending: Internal Medicine | Admitting: Internal Medicine

## 2014-03-16 ENCOUNTER — Encounter (HOSPITAL_COMMUNITY)
Admission: RE | Disposition: A | Discharge status: Home / Self Care | Payer: Self-pay | Source: Ambulatory Visit | Attending: Internal Medicine

## 2014-03-16 ENCOUNTER — Encounter (HOSPITAL_COMMUNITY): Payer: Self-pay | Admitting: *Deleted

## 2014-03-16 DIAGNOSIS — I10 Essential (primary) hypertension: Secondary | ICD-10-CM | POA: Insufficient documentation

## 2014-03-16 DIAGNOSIS — Z6836 Body mass index (BMI) 36.0-36.9, adult: Secondary | ICD-10-CM | POA: Insufficient documentation

## 2014-03-16 DIAGNOSIS — Z1211 Encounter for screening for malignant neoplasm of colon: Secondary | ICD-10-CM

## 2014-03-16 DIAGNOSIS — F329 Major depressive disorder, single episode, unspecified: Secondary | ICD-10-CM | POA: Insufficient documentation

## 2014-03-16 DIAGNOSIS — Z8249 Family history of ischemic heart disease and other diseases of the circulatory system: Secondary | ICD-10-CM | POA: Insufficient documentation

## 2014-03-16 DIAGNOSIS — E039 Hypothyroidism, unspecified: Secondary | ICD-10-CM | POA: Diagnosis not present

## 2014-03-16 DIAGNOSIS — K529 Noninfective gastroenteritis and colitis, unspecified: Secondary | ICD-10-CM | POA: Diagnosis present

## 2014-03-16 DIAGNOSIS — D122 Benign neoplasm of ascending colon: Secondary | ICD-10-CM | POA: Diagnosis not present

## 2014-03-16 DIAGNOSIS — E785 Hyperlipidemia, unspecified: Secondary | ICD-10-CM | POA: Diagnosis not present

## 2014-03-16 DIAGNOSIS — K644 Residual hemorrhoidal skin tags: Secondary | ICD-10-CM | POA: Diagnosis not present

## 2014-03-16 DIAGNOSIS — Z79899 Other long term (current) drug therapy: Secondary | ICD-10-CM | POA: Insufficient documentation

## 2014-03-16 DIAGNOSIS — K649 Unspecified hemorrhoids: Secondary | ICD-10-CM

## 2014-03-16 DIAGNOSIS — K5730 Diverticulosis of large intestine without perforation or abscess without bleeding: Secondary | ICD-10-CM | POA: Insufficient documentation

## 2014-03-16 DIAGNOSIS — M1990 Unspecified osteoarthritis, unspecified site: Secondary | ICD-10-CM | POA: Insufficient documentation

## 2014-03-16 DIAGNOSIS — Z9089 Acquired absence of other organs: Secondary | ICD-10-CM | POA: Diagnosis not present

## 2014-03-16 DIAGNOSIS — R198 Other specified symptoms and signs involving the digestive system and abdomen: Secondary | ICD-10-CM

## 2014-03-16 DIAGNOSIS — K5289 Other specified noninfective gastroenteritis and colitis: Secondary | ICD-10-CM

## 2014-03-16 HISTORY — PX: COLONOSCOPY: SHX5424

## 2014-03-16 SURGERY — COLONOSCOPY
Anesthesia: Moderate Sedation

## 2014-03-16 MED ORDER — MEPERIDINE HCL 50 MG/ML IJ SOLN
INTRAMUSCULAR | Status: AC
  Filled 2014-03-16: qty 1

## 2014-03-16 MED ORDER — MIDAZOLAM HCL 5 MG/5ML IJ SOLN
INTRAMUSCULAR | Status: DC | PRN
  Administered 2014-03-16: 1 mg via INTRAVENOUS
  Administered 2014-03-16 (×2): 2 mg via INTRAVENOUS

## 2014-03-16 MED ORDER — SODIUM CHLORIDE 0.9 % IV SOLN
INTRAVENOUS | Status: DC
  Administered 2014-03-16: 1000 mL via INTRAVENOUS

## 2014-03-16 MED ORDER — MIDAZOLAM HCL 5 MG/5ML IJ SOLN
INTRAMUSCULAR | Status: AC
  Filled 2014-03-16: qty 10

## 2014-03-16 MED ORDER — MEPERIDINE HCL 50 MG/ML IJ SOLN
INTRAMUSCULAR | Status: DC | PRN
  Administered 2014-03-16 (×2): 25 mg via INTRAVENOUS

## 2014-03-16 MED ORDER — LOPERAMIDE HCL 2 MG PO TABS
1.0000 mg | ORAL_TABLET | Freq: Every day | ORAL | Status: DC | PRN
Start: 2014-03-16 — End: 2021-12-10

## 2014-03-16 MED ORDER — SIMETHICONE 40 MG/0.6ML PO SUSP
ORAL | Status: DC | PRN
  Administered 2014-03-16: 08:00:00

## 2014-03-16 NOTE — H&P (Signed)
Cassie Hanson is an 78 y.o. female.   Chief Complaint:  Patient is here for colonoscopy. HPI:  Patient is 78 year old Caucasian female who presents with over 2 year history of diarrhea. On most that she has 2-3 stools are loose. She denies rectal bleeding melena abdominal pain anorexia or weight loss. She does not have nocturnal diarrhea or constipation. Patient's last colonoscopy was in 2005. History is negative for CRC.  Past Medical History  Diagnosis Date  . Bursitis disorder   . Osteoarthrosis, unspecified whether generalized or localized, unspecified site   . Pain in joint     INVOLVING LOWER LEG  . Reflux esophagitis   . Lumbago   . Other and unspecified hyperlipidemia   . Unspecified hypothyroidism   . Unspecified vitamin D deficiency   . Depressive disorder, not elsewhere classified   . Morbid obesity   . Hypertension     Past Surgical History  Procedure Laterality Date  . Abdominal hysterectomy    . Cholecystectomy    . Back surgery    . Unlisted procedure      FOREARM/WRIST    Family History  Problem Relation Age of Onset  . Other      ARTHROPATHY, UNSPECIFIED SITE  . Hypertension    . Stroke Father   . Diabetes    . Cancer Mother     BREAST   Social History:  reports that she has never smoked. She has never used smokeless tobacco. She reports that she does not drink alcohol or use illicit drugs.  Allergies:  Allergies  Allergen Reactions  . Keflex [Cephalexin]     Medications Prior to Admission  Medication Sig Dispense Refill  . donepezil (ARICEPT) 10 MG tablet Take 10 mg by mouth at bedtime.     Marland Kitchen ipratropium (ATROVENT) 0.06 % nasal spray Place 1 spray into both nostrils 3 (three) times daily as needed for rhinitis.     Marland Kitchen levothyroxine (SYNTHROID, LEVOTHROID) 75 MCG tablet Take 75 mcg by mouth daily before breakfast.     . polyethylene glycol-electrolytes (NULYTELY/GOLYTELY) 420 G solution Take 4,000 mLs by mouth once. 4000 mL 0  . PROAIR HFA 108  (90 BASE) MCG/ACT inhaler Inhale 1 puff into the lungs every 4 (four) hours as needed for shortness of breath.     . loratadine (CLARITIN) 10 MG tablet Take 10 mg by mouth daily.    . Multiple Vitamins-Minerals (CENTRUM SILVER PO) Take 1 tablet by mouth daily.     Marland Kitchen omeprazole (PRILOSEC) 20 MG capsule Take 20 mg by mouth daily.     . sertraline (ZOLOFT) 100 MG tablet Take 100 mg by mouth at bedtime.       No results found for this or any previous visit (from the past 48 hour(s)). No results found.  ROS  Blood pressure 146/47, pulse 62, temperature 97.7 F (36.5 C), temperature source Oral, resp. rate 14, height 5\' 2"  (1.575 m), weight 202 lb (91.627 kg), SpO2 96 %. Physical Exam  Constitutional: She appears well-developed and well-nourished.  HENT:  Mouth/Throat: Oropharynx is clear and moist.  Eyes: Conjunctivae are normal. No scleral icterus.  Neck: No thyromegaly present.  Cardiovascular: Normal rate, regular rhythm and normal heart sounds.   No murmur heard. Respiratory: Effort normal and breath sounds normal.  GI: Soft. She exhibits no distension and no mass. There is no tenderness.  Musculoskeletal: She exhibits no edema.  Lymphadenopathy:    She has no cervical adenopathy.  Neurological: She is alert.  Skin: Skin is warm.     Assessment/Plan Chronic diarrhea. Diagnostic colonoscopy.  Jahmiyah Dullea U 03/16/2014, 7:33 AM

## 2014-03-16 NOTE — Discharge Instructions (Signed)
Resume usual medications and high fiber diet. Imodium OTC or loperamide 1 mg by mouth every morning. No driving for 24 hours Physician will call with biopsy results   Colonoscopy, Care After Refer to this sheet in the next few weeks. These instructions provide you with information on caring for yourself after your procedure. Your health care provider may also give you more specific instructions. Your treatment has been planned according to current medical practices, but problems sometimes occur. Call your health care provider if you have any problems or questions after your procedure. WHAT TO EXPECT AFTER THE PROCEDURE  After your procedure, it is typical to have the following:  A small amount of blood in your stool.  Moderate amounts of gas and mild abdominal cramping or bloating. HOME CARE INSTRUCTIONS  Do not drive, operate machinery, or sign important documents for 24 hours.  You may shower and resume your regular physical activities, but move at a slower pace for the first 24 hours.  Take frequent rest periods for the first 24 hours.  Walk around or put a warm pack on your abdomen to help reduce abdominal cramping and bloating.  Drink enough fluids to keep your urine clear or pale yellow.  You may resume your normal diet as instructed by your health care provider. Avoid heavy or fried foods that are hard to digest.  Avoid drinking alcohol for 24 hours or as instructed by your health care provider.  Only take over-the-counter or prescription medicines as directed by your health care provider.  If a tissue sample (biopsy) was taken during your procedure:  Do not take aspirin or blood thinners for 7 days, or as instructed by your health care provider.  Do not drink alcohol for 7 days, or as instructed by your health care provider.  Eat soft foods for the first 24 hours. SEEK MEDICAL CARE IF: You have persistent spotting of blood in your stool 2-3 days after the  procedure. SEEK IMMEDIATE MEDICAL CARE IF:  You have more than a small spotting of blood in your stool.  You pass large blood clots in your stool.  Your abdomen is swollen (distended).  You have nausea or vomiting.  You have a fever.  You have increasing abdominal pain that is not relieved with medicine. Document Released: 11/27/2003 Document Revised: 02/02/2013 Document Reviewed: 12/20/2012 [facility] Patient Information 2015 [place], [place]. This information is not intended to replace advice given to you by your health care provider. Make sure you discuss any questions you have with your health care provider.

## 2014-03-16 NOTE — Op Note (Signed)
COLONOSCOPY PROCEDURE REPORT  PATIENT:  Cassie Hanson  MR#:  [id number] Birthdate:  1935-06-07, 78 y.o., female Endoscopist:  Dr. Rogene Houston, MD Referred By:  Dr. Alonza Bogus, MD  Procedure Date: 03/16/2014  Procedure:   Colonoscopy  Indications:  Patient is 78 year old Caucasian female who presents with chronic diarrhea. She denies rectal bleeding or weight loss. Last colonoscopy was 10 years ago.  Informed Consent:  The procedure and risks were reviewed with the patient and informed consent was obtained.  Medications:  Demerol 50 mg IV Versed 5 mg IV  Description of procedure:  After a digital rectal exam was performed, that colonoscope was advanced from the anus through the rectum and colon to the area of the cecum, ileocecal valve and appendiceal orifice. The cecum was deeply intubated. These structures were well-seen and photographed for the record. From the level of the cecum and ileocecal valve, the scope was slowly and cautiously withdrawn. The mucosal surfaces were carefully surveyed utilizing scope tip to flexion to facilitate fold flattening as needed. The scope was pulled down into the rectum where a thorough exam including retroflexion was performed.  Findings:   Prep satisfactory. 3 mm polyp ablated via cold biopsy from ascending colon. Few small diverticula at sigmoid colon. Normal rectal mucosa. Small hemorrhoids below the dentate line.  Random biopsies taken from normal-appearing mucosa of sigmoid colon.  Therapeutic/Diagnostic Maneuvers Performed:  See above  Complications:  None  Cecal Withdrawal Time:  8  minutes  Impression:  Examination performed to cecum. Small polyp at ascending colon ablated via cold biopsy. Mild sigmoid colon diverticulosis. Random biopsies taken from normal-appearing mucosa of sigmoid colon looking for microscopic or collagenous colitis. Small external hemorrhoids.  Recommendations:  Standard instructions given. Imodium  OTC 1 mg by mouth every morning. I will contact patient with biopsy results and further recommendations.  Alassane Kalafut U  03/16/2014 8:14 AM  CC: Dr. Alonza Bogus, MD & Dr. Rayne Du ref. provider found

## 2014-03-17 ENCOUNTER — Encounter (HOSPITAL_COMMUNITY): Payer: Self-pay | Admitting: Internal Medicine

## 2014-03-21 ENCOUNTER — Encounter (INDEPENDENT_AMBULATORY_CARE_PROVIDER_SITE_OTHER): Payer: Self-pay | Admitting: *Deleted

## 2014-03-30 ENCOUNTER — Ambulatory Visit: Payer: Medicare HMO | Admitting: Neurology

## 2014-04-19 ENCOUNTER — Encounter (INDEPENDENT_AMBULATORY_CARE_PROVIDER_SITE_OTHER): Payer: Self-pay

## 2014-05-15 ENCOUNTER — Ambulatory Visit: Payer: Medicare HMO | Admitting: Neurology

## 2014-05-18 ENCOUNTER — Ambulatory Visit: Payer: Self-pay | Admitting: Neurology

## 2014-05-30 ENCOUNTER — Encounter: Payer: Self-pay | Admitting: Neurology

## 2014-07-03 ENCOUNTER — Ambulatory Visit (INDEPENDENT_AMBULATORY_CARE_PROVIDER_SITE_OTHER): Payer: Medicare Other | Admitting: Neurology

## 2014-07-03 ENCOUNTER — Encounter: Payer: Self-pay | Admitting: Neurology

## 2014-07-03 VITALS — BP 159/65 | HR 78 | Ht 62.0 in | Wt 189.0 lb

## 2014-07-03 DIAGNOSIS — R270 Ataxia, unspecified: Secondary | ICD-10-CM | POA: Diagnosis not present

## 2014-07-03 DIAGNOSIS — R413 Other amnesia: Secondary | ICD-10-CM

## 2014-07-03 DIAGNOSIS — R51 Headache: Secondary | ICD-10-CM

## 2014-07-03 DIAGNOSIS — R519 Headache, unspecified: Secondary | ICD-10-CM

## 2014-07-03 DIAGNOSIS — R42 Dizziness and giddiness: Secondary | ICD-10-CM

## 2014-07-03 NOTE — Progress Notes (Addendum)
[facility]    Provider:  Dr Jaynee Eagles Referring Provider: Sinda Du, MD Primary Care Physician:  Alonza Bogus, MD  CC:  Memory changes  HPI:  Cassie Hanson is a 79 y.o. female here as a follow up  Interval update 07/04/2014: She is in a nursing home now and she is not living with her son and daughter in law. 2-3 times, sweaty, got dizzy and lightheaded. She has dizzyness, no room spinning, she staggers, not lightheaded. She has a history of bad headaches. Bad headaches daily. Head hurts on the top on the forehead. She has them during the day. No light sensitivity, sound sensitivity, no nausea. The headaches are not getting worse. She has had bad headaches all her life. Has some neck pain. Feels memory has improved. We discussed her last urine test. She asks the same things over and over again. She is appears to be having some memory problems to sisters, she sometimes repeats things over and over. Today in the office she asked three times "how does someone get access to fentanyl?" and her sisters would answer, a few minutes later she asked again. Bad headaches continue. No vision changes, no hearing changes.   CC: Cognitive dysfunction  Initial visit 12/27/5174: Cassie Hanson is a 79 y.o. female here as a referral from Dr. Luan Pulling for cognitive dysfunction  Patient is a lovely 79 year old female who is here with her 2 sisters. Patient is worried about the medications she is being given at home. Sisters feel that patient is being given inappropriate medication at home too. It all started over a year ago when patient was given very strong pain medications after a dental procedure. She was on the medication three times a day. Sisters noticed that the patient was odd on the phone at that time; She seemed confused. Sisters took patient to the doctor and complained about the strong pain meds The sisters complained to the son and daughter-in-law as well and told them to stop  giving patient strong pain medications and went as far as throwing medications in the trash. The patient improved after that for a while but the sisters have noticed her acting in a similar way over the last year.   Patient lives with son and daughter in law. Patient doesn't know what medications she takes. Patient is given medications at home by her daughter-in-law and son and her meds are also placed into her pill manager for her. Patient and sisters want to know if this is really dementia or if patient is being given narcotics or other medications without her knowledge, to make her confused, possibly a urine or blood drug test would shed some light. Patient wants to know exactly what she is taking. Patient says she is not in pain so does not know why she would be taking any pain medication. She has a recent sore throat but denies back pain, denies joint pain or any other pain at all.   Patient says she feels confused/foggy and her mouth is dry all the time. Patient's sister gives an example that one day the patient didn't know where she was or if her husband was there. Patient spends 2 days a week with her sister. The sister sometimes tells the patient a plan multiple times, like when to be there at her house, and the patient still forgots. No hallucinations or delusions, or personality changes. Patient sometimes stares off in confusion. Patient denies depression and does not want to be diagnosed  with dementia, she denies memory issues and says she is just busy and goes to a lot of places and has a lot of things going on and has a lot of current stressors. Patient's husband is in a nursing home, patient feels like she is sad most days, she doesn't drive, doesn't have a car, doesn't live at home anymore which saddens her. She still likes going out and being with people. Denies loss of concentration. Tries to go the nursing home every day to visit husband. Denies excessive drowsiness. Denies being sleepy. No  significant weight changes. Patient can remember old things like the address from where she lived as a child. Remembers what she had for lunch yesterday. She is always smacking her lips because she is dry in her mouth. Patient can dial phone numbers, feed and dress herself. No recent trauma to the head. No stroke-like symptoms but sisters feel her eyes look droopy like she is impaired.   Patient does not want her son or sister-in-law to know she was here in the office seeing a doctor and does not want them to have access to her medical records or information. She does not want me to call and ask them what medications she is being given in the home. Reverse lookup shows that in the pill box today is sertraline, aricept and omeprazole.   Reviewed notes, labs and imaging from outside physicians, which showed Vicodin was prescribed in 2013 with 5 refills by Dr. Sinda Du, Hydroxyzine was prescribed in 2014 and she also has reglan, sertraline, aricept, norco, simvastatin, proair on her [facility] doc which was printed 12/14/2013 and sent to our office, unclear if these are still valid prescriptions. Personally reviewed CT of the head images, atrophy and ventricle size appropriate for age. No significant white matter disease or large ischemic infarct.   Review of Systems: Patient complains of symptoms per HPI as well as the following symptoms: cough, runny nose, wheezing, thirst, joint pain, back pain, snoring, memory loss, dizziness, confusion. Pertinent negatives per HPI. All others negative.   History   Social History  . Marital Status: Married    Spouse Name: N/A  . Number of Children: 1  . Years of Education: 12   Occupational History  . Not on file.   Social History Main Topics  . Smoking status: Never Smoker   . Smokeless tobacco: Never Used  . Alcohol Use: No  . Drug Use: No  . Sexual Activity: Not on file   Other Topics Concern  . Not on file   Social History Narrative   Patient is  married with one child.   Patient is right handed.   Patient has 12 th grade education.   Caffeine use: Drinks diet Mt. Dew occasionally, very rare drinks coffee, drinks Tea quite often    Family History  Problem Relation Age of Onset  . Other      ARTHROPATHY, UNSPECIFIED SITE  . Hypertension    . Stroke Father   . Diabetes    . Cancer Mother     BREAST    Past Medical History  Diagnosis Date  . Bursitis disorder   . Osteoarthrosis, unspecified whether generalized or localized, unspecified site   . Pain in joint     INVOLVING LOWER LEG  . Reflux esophagitis   . Lumbago   . Other and unspecified hyperlipidemia   . Unspecified hypothyroidism   . Unspecified vitamin D deficiency   . Depressive disorder, not elsewhere classified   .  Morbid obesity   . Hypertension     Past Surgical History  Procedure Laterality Date  . Abdominal hysterectomy    . Cholecystectomy    . Back surgery    . Unlisted procedure      FOREARM/WRIST  . Colonoscopy N/A 03/16/2014    Procedure: COLONOSCOPY;  Surgeon: Rogene Houston, MD;  Location: [facility];  Service: Endoscopy;  Laterality: N/A;  1200 - moved to 7:30 - Ann to notify pt    Current Outpatient Prescriptions  Medication Sig Dispense Refill  . donepezil (ARICEPT) 10 MG tablet Take 10 mg by mouth at bedtime.     Marland Kitchen ipratropium (ATROVENT) 0.06 % nasal spray Place 1 spray into both nostrils 3 (three) times daily as needed for rhinitis.     Marland Kitchen levothyroxine (SYNTHROID, LEVOTHROID) 75 MCG tablet Take 75 mcg by mouth daily before breakfast.     . loperamide (IMODIUM A-D) 2 MG tablet Take 0.5 tablets (1 mg total) by mouth daily as needed for diarrhea or loose stools. 30 tablet 0  . loratadine (CLARITIN) 10 MG tablet Take 10 mg by mouth daily.    . Multiple Vitamins-Minerals (CENTRUM SILVER PO) Take 1 tablet by mouth daily.     Marland Kitchen omeprazole (PRILOSEC) 20 MG capsule Take 20 mg by mouth daily.     Marland Kitchen PROAIR HFA 108 (90 BASE) MCG/ACT inhaler  Inhale 1 puff into the lungs every 4 (four) hours as needed for shortness of breath.     . sertraline (ZOLOFT) 100 MG tablet Take 100 mg by mouth at bedtime.      No current facility-administered medications for this visit.    Allergies as of 07/03/2014 - Review Complete 07/03/2014  Allergen Reaction Noted  . Keflex [cephalexin]  12/28/2013    Vitals: BP 159/65 mmHg  Pulse 78  Ht 5\' 2"  (1.575 m)  Wt 189 lb (85.73 kg)  BMI 34.56 kg/m2 Last Weight:  Wt Readings from Last 1 Encounters:  07/03/14 189 lb (85.73 kg)   Last Height:   Ht Readings from Last 1 Encounters:  07/03/14 5\' 2"  (1.575 m)    Speech:  No dysarthria Cognition:  18/30 MOCA at last appt. Today is 11/30 Cranial Nerves:  The pupils are equal, round, and reactive to light. The fundi are normal and spontaneous venous pulsations are present. Visual fields are full to finger confrontation. Mildly impaired upgaze otherwise extraocular movements are intact. Trigeminal sensation is intact and the muscles of mastication are normal. The face is symmetric. The palate elevates in the midline. Voice is normal. Shoulder shrug is normal. The tongue has normal motion without fasciculations.   Coordination: FTN without dysmetria  Gait:  Unable to perform heel to toe, impaired balance  Motor Observation:  Patient is making persistent noises in her mouth throughout the appointment Tone:  Normal muscle tone.   Posture:  Posture is normal. normal erect   Strength:  Mild hip flexion weakness otherwise strength is V/V in the upper and lower limbs.       Assessment/Plan:  79 year old year old female, accompanied by her 2 sisters, who is here for follow up of cognitive changes. She no longer lives with son and daughter-in-law. Cognitive changes started about a 2 years ago. At last appointment fentanyl was found in her urine and patient says she feels better now, thinks she was being drugged. However MoCA is  worse today 11/30, a significant decrease.  Will order MRi of the brain due to  progressive decline (MoCA 18/30 last appt, today 11/30) Declined drug testing today, is back living with husband again so no need, not living with her son and daughter in law  ASA 81 mg daily for stroke prevention. Follow with pcp to closely manage vascular risk factors.    Sarina Ill, MD  [facility] [address] [place], [place] [address]  Phone [phone] Fax [phone]  A total of 30 minutes was spent face-to-face with this patient. Over half this time was spent on counseling patient on the cognitive decline diagnosis and different diagnostic and therapeutic options available.

## 2014-07-03 NOTE — Patient Instructions (Signed)
Overall you are doing fairly well but I do want to suggest a few things today:   Remember to drink plenty of fluid, eat healthy meals and do not skip any meals. Try to eat protein with a every meal and eat a healthy snack such as fruit or nuts in between meals. Try to keep a regular sleep-wake schedule and try to exercise daily, particularly in the form of walking, 20-30 minutes a day, if you can.   As far as your medications are concerned, I would like to suggest: Asa baby aspirin 81mg   As far as diagnostic testing: MRI of the brain  I would like to see you back in 4-6 months, sooner if we need to. Please call us with any interim questions, concerns, problems, updates or refill requests.   Please also call us for any test results so we can go over those with you on the phone.  My clinical assistant and will answer any of your questions and relay your messages to me and also relay most of my messages to you.   Our phone number is [phone]. We also have an after hours call service for urgent matters and there is a physician on-call for urgent questions. For any emergencies you know to call 911 or go to the nearest emergency room

## 2014-07-14 ENCOUNTER — Ambulatory Visit
Admission: RE | Admit: 2014-07-14 | Discharge: 2014-07-14 | Disposition: A | Discharge status: Home / Self Care | Payer: No Typology Code available for payment source | Source: Ambulatory Visit | Attending: Neurology | Admitting: Neurology

## 2014-07-14 DIAGNOSIS — R42 Dizziness and giddiness: Secondary | ICD-10-CM

## 2014-07-14 DIAGNOSIS — R413 Other amnesia: Secondary | ICD-10-CM

## 2014-07-14 DIAGNOSIS — R519 Headache, unspecified: Secondary | ICD-10-CM

## 2014-07-14 DIAGNOSIS — R51 Headache: Secondary | ICD-10-CM

## 2014-07-14 DIAGNOSIS — R270 Ataxia, unspecified: Secondary | ICD-10-CM | POA: Diagnosis not present

## 2014-07-17 ENCOUNTER — Telehealth: Payer: Self-pay | Admitting: *Deleted

## 2014-07-17 NOTE — Telephone Encounter (Signed)
Left a message for the pt to call back. Gave [facility] phone number and office hours.

## 2014-07-18 ENCOUNTER — Telehealth: Payer: Self-pay | Admitting: *Deleted

## 2014-07-18 NOTE — Telephone Encounter (Signed)
Talked with pt sister Joycelyn Schmid) and talked with her about pt MRI results per Dr. Jaynee Eagles notes. Margaret verbalized understanding.

## 2014-12-29 ENCOUNTER — Other Ambulatory Visit: Payer: Self-pay

## 2014-12-29 DIAGNOSIS — Z1231 Encounter for screening mammogram for malignant neoplasm of breast: Secondary | ICD-10-CM

## 2015-01-24 ENCOUNTER — Ambulatory Visit
Admission: RE | Admit: 2015-01-24 | Discharge: 2015-01-24 | Disposition: A | Discharge status: Home / Self Care | Payer: Medicare Other | Source: Ambulatory Visit

## 2015-01-24 DIAGNOSIS — Z1231 Encounter for screening mammogram for malignant neoplasm of breast: Secondary | ICD-10-CM

## 2015-01-26 ENCOUNTER — Other Ambulatory Visit: Payer: Self-pay | Admitting: Pulmonary Disease

## 2015-01-26 DIAGNOSIS — R928 Other abnormal and inconclusive findings on diagnostic imaging of breast: Secondary | ICD-10-CM

## 2015-02-05 ENCOUNTER — Other Ambulatory Visit: Payer: Medicare Other

## 2015-02-06 ENCOUNTER — Ambulatory Visit
Admission: RE | Admit: 2015-02-06 | Discharge: 2015-02-06 | Disposition: A | Discharge status: Home / Self Care | Payer: Medicare Other | Source: Ambulatory Visit | Attending: Pulmonary Disease | Admitting: Pulmonary Disease

## 2015-02-06 DIAGNOSIS — R928 Other abnormal and inconclusive findings on diagnostic imaging of breast: Secondary | ICD-10-CM

## 2016-03-03 ENCOUNTER — Ambulatory Visit (INDEPENDENT_AMBULATORY_CARE_PROVIDER_SITE_OTHER): Payer: Medicare Other | Admitting: Otolaryngology

## 2016-12-04 ENCOUNTER — Encounter (HOSPITAL_BASED_OUTPATIENT_CLINIC_OR_DEPARTMENT_OTHER): Payer: Medicare Other | Attending: Internal Medicine

## 2016-12-04 DIAGNOSIS — I129 Hypertensive chronic kidney disease with stage 1 through stage 4 chronic kidney disease, or unspecified chronic kidney disease: Secondary | ICD-10-CM | POA: Insufficient documentation

## 2016-12-04 DIAGNOSIS — Z7982 Long term (current) use of aspirin: Secondary | ICD-10-CM | POA: Insufficient documentation

## 2016-12-04 DIAGNOSIS — L89313 Pressure ulcer of right buttock, stage 3: Secondary | ICD-10-CM | POA: Diagnosis not present

## 2016-12-04 DIAGNOSIS — F0280 Dementia in other diseases classified elsewhere without behavioral disturbance: Secondary | ICD-10-CM | POA: Diagnosis not present

## 2016-12-04 DIAGNOSIS — N183 Chronic kidney disease, stage 3 (moderate): Secondary | ICD-10-CM | POA: Insufficient documentation

## 2016-12-04 DIAGNOSIS — Z79899 Other long term (current) drug therapy: Secondary | ICD-10-CM | POA: Insufficient documentation

## 2016-12-04 DIAGNOSIS — B359 Dermatophytosis, unspecified: Secondary | ICD-10-CM | POA: Diagnosis not present

## 2016-12-04 DIAGNOSIS — G309 Alzheimer's disease, unspecified: Secondary | ICD-10-CM | POA: Diagnosis not present

## 2016-12-19 DIAGNOSIS — L89313 Pressure ulcer of right buttock, stage 3: Secondary | ICD-10-CM | POA: Diagnosis not present

## 2017-01-02 ENCOUNTER — Encounter (HOSPITAL_BASED_OUTPATIENT_CLINIC_OR_DEPARTMENT_OTHER): Payer: Medicare Other | Attending: Internal Medicine

## 2017-01-02 DIAGNOSIS — G309 Alzheimer's disease, unspecified: Secondary | ICD-10-CM | POA: Diagnosis not present

## 2017-01-02 DIAGNOSIS — F0280 Dementia in other diseases classified elsewhere without behavioral disturbance: Secondary | ICD-10-CM | POA: Diagnosis not present

## 2017-01-02 DIAGNOSIS — B359 Dermatophytosis, unspecified: Secondary | ICD-10-CM | POA: Diagnosis not present

## 2017-01-02 DIAGNOSIS — I129 Hypertensive chronic kidney disease with stage 1 through stage 4 chronic kidney disease, or unspecified chronic kidney disease: Secondary | ICD-10-CM | POA: Diagnosis not present

## 2017-01-02 DIAGNOSIS — L89313 Pressure ulcer of right buttock, stage 3: Secondary | ICD-10-CM | POA: Diagnosis not present

## 2017-01-02 DIAGNOSIS — N183 Chronic kidney disease, stage 3 (moderate): Secondary | ICD-10-CM | POA: Diagnosis not present

## 2019-04-01 ENCOUNTER — Encounter (HOSPITAL_COMMUNITY): Payer: Self-pay

## 2019-04-01 ENCOUNTER — Emergency Department (HOSPITAL_COMMUNITY)
Admission: EM | Admit: 2019-04-01 | Discharge: 2019-04-01 | Disposition: A | Discharge status: Home / Self Care | Payer: Medicare Other | Attending: Emergency Medicine | Admitting: Emergency Medicine

## 2019-04-01 ENCOUNTER — Emergency Department (HOSPITAL_COMMUNITY): Payer: Medicare Other

## 2019-04-01 ENCOUNTER — Other Ambulatory Visit: Payer: Self-pay

## 2019-04-01 DIAGNOSIS — Y999 Unspecified external cause status: Secondary | ICD-10-CM | POA: Diagnosis not present

## 2019-04-01 DIAGNOSIS — S0181XA Laceration without foreign body of other part of head, initial encounter: Secondary | ICD-10-CM | POA: Diagnosis present

## 2019-04-01 DIAGNOSIS — Y939 Activity, unspecified: Secondary | ICD-10-CM | POA: Diagnosis not present

## 2019-04-01 DIAGNOSIS — Y92129 Unspecified place in nursing home as the place of occurrence of the external cause: Secondary | ICD-10-CM | POA: Diagnosis not present

## 2019-04-01 DIAGNOSIS — W19XXXA Unspecified fall, initial encounter: Secondary | ICD-10-CM | POA: Insufficient documentation

## 2019-04-01 DIAGNOSIS — I10 Essential (primary) hypertension: Secondary | ICD-10-CM | POA: Insufficient documentation

## 2019-04-01 DIAGNOSIS — E039 Hypothyroidism, unspecified: Secondary | ICD-10-CM | POA: Insufficient documentation

## 2019-04-01 DIAGNOSIS — F0390 Unspecified dementia without behavioral disturbance: Secondary | ICD-10-CM | POA: Insufficient documentation

## 2019-04-01 MED ORDER — LIDOCAINE-EPINEPHRINE (PF) 1 %-1:200000 IJ SOLN
10.0000 mL | Freq: Once | INTRAMUSCULAR | Status: AC
  Administered 2019-04-01: 10 mL
  Filled 2019-04-01: qty 30

## 2019-04-01 NOTE — Discharge Instructions (Addendum)
You were evaluated in the Emergency Department and after careful evaluation, we did not find any emergent condition requiring admission or further testing in the hospital.  Your exam/testing today is overall reassuring.  CAT scan did not show any internal injuries.  Laceration was repaired with absorbable sutures that do not need to be removed.  Please return to the Emergency Department if you experience any worsening of your condition.  We encourage you to follow up with a primary care provider.  Thank you for allowing [place] to be a part of your care.

## 2019-04-01 NOTE — ED Triage Notes (Signed)
Pt is from [facility], in by [facility] after a witnessed fall.  Pt has a small lac to her right forehead, bleeding controlled.  No Loc.  Pt has dementia, somewhat uncooperative per her normal

## 2019-04-01 NOTE — ED Notes (Signed)
Pt present with 3cm laceration above right brow. 2 steri strips in place prior to arrival from [facility]. Bleeding controlled.

## 2019-04-01 NOTE — ED Notes (Signed)
Pt family called and POA updated on Pt status.

## 2019-04-01 NOTE — ED Notes (Signed)
Suture Cart at bedside.  

## 2019-04-01 NOTE — ED Notes (Signed)
ED Provider at bedside. 

## 2019-04-01 NOTE — ED Provider Notes (Signed)
[facility] Emergency Department Provider Note MRN:  [id number]  Arrival date & time: 04/01/19     Chief Complaint   Fall   History of Present Illness   Cassie Hanson is a 83 y.o. year-old female with a history of dementia presenting to the ED with chief complaint of fall.  Witnessed fall, head trauma, no loss of consciousness, patient acting at her baseline currently.  I was unable to obtain an accurate HPI, PMH, or ROS due to the patient's dementia.  Level 5 caveat.  Review of Systems  Fall, head trauma  Patient's Health History    Past Medical History:  Diagnosis Date  . Bursitis disorder   . Depressive disorder, not elsewhere classified   . Hypertension   . Lumbago   . Morbid obesity ([place])   . Osteoarthrosis, unspecified whether generalized or localized, unspecified site   . Other and unspecified hyperlipidemia   . Pain in joint    INVOLVING LOWER LEG  . Reflux esophagitis   . Unspecified hypothyroidism   . Unspecified vitamin D deficiency     Past Surgical History:  Procedure Laterality Date  . ABDOMINAL HYSTERECTOMY    . BACK SURGERY    . CHOLECYSTECTOMY    . COLONOSCOPY N/A 03/16/2014   Procedure: COLONOSCOPY;  Surgeon: Rogene Houston, MD;  Location: [facility];  Service: Endoscopy;  Laterality: N/A;  1200 - moved to 7:30 - Ann to notify pt  . UNLISTED PROCEDURE     FOREARM/WRIST    Family History  Problem Relation Age of Onset  . Stroke Father   . Cancer Mother        BREAST  . Other Other        ARTHROPATHY, UNSPECIFIED SITE  . Hypertension Other   . Diabetes Other     Social History   Socioeconomic History  . Marital status: Married    Spouse name: Not on file  . Number of children: 1  . Years of education: 3  . Highest education level: Not on file  Occupational History  . Not on file  Social Needs  . Financial resource strain: Not on file  . Food insecurity    Worry: Not on file    Inability: Not on file   . Transportation needs    Medical: Not on file    Non-medical: Not on file  Tobacco Use  . Smoking status: Never Smoker  . Smokeless tobacco: Never Used  Substance and Sexual Activity  . Alcohol use: No  . Drug use: No  . Sexual activity: Not on file  Lifestyle  . Physical activity    Days per week: Not on file    Minutes per session: Not on file  . Stress: Not on file  Relationships  . Social Herbalist on phone: Not on file    Gets together: Not on file    Attends religious service: Not on file    Active member of club or organization: Not on file    Attends meetings of clubs or organizations: Not on file    Relationship status: Not on file  . Intimate partner violence    Fear of current or ex partner: Not on file    Emotionally abused: Not on file    Physically abused: Not on file    Forced sexual activity: Not on file  Other Topics Concern  . Not on file  Social History Narrative   Patient is  married with one child.   Patient is right handed.   Patient has 12 th grade education.   Caffeine use: Drinks diet Mt. Dew occasionally, very rare drinks coffee, drinks Tea quite often     Physical Exam  Vital Signs and Nursing Notes reviewed Vitals:   04/01/19 1940  BP: (!) 179/65  Pulse: 87  Resp: 18  Temp: (!) 97.4 F (36.3 C)  SpO2: 100%    CONSTITUTIONAL: Well-appearing, NAD NEURO:  Alert and oriented x 3, no focal deficits EYES:  eyes equal and reactive ENT/NECK:  no LAD, no JVD CARDIO: Regular rate, well-perfused, normal S1 and S2 PULM:  CTAB no wheezing or rhonchi GI/GU:  normal bowel sounds, non-distended, non-tender MSK/SPINE:  No gross deformities, no edema SKIN: Linear laceration to the right forehead PSYCH:  Appropriate speech and behavior  Diagnostic and Interventional Summary    EKG Interpretation  Date/Time:    Ventricular Rate:    PR Interval:    QRS Duration:   QT Interval:    QTC Calculation:   R Axis:     Text  Interpretation:        Labs Reviewed - No data to display  CT Head  Final Result    CT Cervical Spine  Final Result      Medications  lidocaine-EPINEPHrine (XYLOCAINE-EPINEPHrine) 1 %-1:200000 (PF) injection 10 mL (has no administration in time range)     Procedures  /  Critical Care .Marland KitchenLaceration Repair  Date/Time: 04/01/2019 10:21 PM Performed by: Maudie Flakes, MD Authorized by: Maudie Flakes, MD   Anesthesia (see MAR for exact dosages):    Anesthesia method:  Local infiltration   Local anesthetic:  Lidocaine 1% WITH epi Laceration details:    Location:  Face   Face location:  R eyebrow   Length (cm):  3   Depth (mm):  2 Repair type:    Repair type:  Simple Pre-procedure details:    Preparation:  Patient was prepped and draped in usual sterile fashion Exploration:    Wound exploration: wound explored through full range of motion and entire depth of wound probed and visualized     Contaminated: no   Treatment:    Area cleansed with:  Saline Skin repair:    Repair method:  Sutures   Suture size:  5-0   Wound skin closure material used: Fast-absorbing Vicryl.   Suture technique:  Simple interrupted   Number of sutures:  4 Approximation:    Approximation:  Close Post-procedure details:    Dressing:  Open (no dressing)   Patient tolerance of procedure:  Tolerated well, no immediate complications    ED Course and Medical Decision Making  I have reviewed the triage vital signs and the nursing notes.  Pertinent labs & imaging results that were available during my care of the patient were reviewed by me and considered in my medical decision making (see below for details).     Fall, head trauma, laceration, normal vital signs, favoring mechanical fall but will attempt to gather more information from facility.  CT to exclude intracranial hemorrhage, laceration repaired as described above.  Anticipating discharge.    Barth Kirks. Sedonia Small, MD [place] [email or web address]  Final Clinical Impressions(s) / ED Diagnoses     ICD-10-CM   1. Fall, initial encounter  W19.XXXA   2. Laceration of brow without complication, initial encounter  S01.81XA     ED Discharge Orders    None  Discharge Instructions Discussed with and Provided to Patient:     Discharge Instructions     You were evaluated in the Emergency Department and after careful evaluation, we did not find any emergent condition requiring admission or further testing in the hospital.  Your exam/testing today is overall reassuring.  CAT scan did not show any internal injuries.  Laceration was repaired with absorbable sutures that do not need to be removed.  Please return to the Emergency Department if you experience any worsening of your condition.  We encourage you to follow up with a primary care provider.  Thank you for allowing [place] to be a part of your care.       Maudie Flakes, MD 04/01/19 2223

## 2019-11-19 ENCOUNTER — Emergency Department (HOSPITAL_COMMUNITY)
Admission: EM | Admit: 2019-11-19 | Discharge: 2019-11-20 | Disposition: A | Discharge status: Home / Self Care | Payer: Medicare Other | Attending: Emergency Medicine | Admitting: Emergency Medicine

## 2019-11-19 ENCOUNTER — Emergency Department (HOSPITAL_COMMUNITY): Payer: Medicare Other

## 2019-11-19 DIAGNOSIS — F0390 Unspecified dementia without behavioral disturbance: Secondary | ICD-10-CM | POA: Insufficient documentation

## 2019-11-19 DIAGNOSIS — Z79899 Other long term (current) drug therapy: Secondary | ICD-10-CM | POA: Insufficient documentation

## 2019-11-19 DIAGNOSIS — E039 Hypothyroidism, unspecified: Secondary | ICD-10-CM | POA: Diagnosis not present

## 2019-11-19 DIAGNOSIS — S0181XA Laceration without foreign body of other part of head, initial encounter: Secondary | ICD-10-CM

## 2019-11-19 DIAGNOSIS — Y939 Activity, unspecified: Secondary | ICD-10-CM | POA: Insufficient documentation

## 2019-11-19 DIAGNOSIS — W19XXXA Unspecified fall, initial encounter: Secondary | ICD-10-CM | POA: Diagnosis not present

## 2019-11-19 DIAGNOSIS — S01112A Laceration without foreign body of left eyelid and periocular area, initial encounter: Secondary | ICD-10-CM | POA: Insufficient documentation

## 2019-11-19 DIAGNOSIS — Y929 Unspecified place or not applicable: Secondary | ICD-10-CM | POA: Diagnosis not present

## 2019-11-19 DIAGNOSIS — I10 Essential (primary) hypertension: Secondary | ICD-10-CM | POA: Diagnosis not present

## 2019-11-19 DIAGNOSIS — Y999 Unspecified external cause status: Secondary | ICD-10-CM | POA: Insufficient documentation

## 2019-11-19 MED ORDER — BACITRACIN ZINC 500 UNIT/GM EX OINT
TOPICAL_OINTMENT | Freq: Once | CUTANEOUS | Status: AC
  Filled 2019-11-19: qty 0.9

## 2019-11-19 MED ORDER — POVIDONE-IODINE 10 % EX SOLN
CUTANEOUS | Status: AC
  Filled 2019-11-19: qty 15

## 2019-11-19 MED ORDER — LIDOCAINE HCL (PF) 1 % IJ SOLN
INTRAMUSCULAR | Status: AC
  Filled 2019-11-19: qty 30

## 2019-11-19 MED ORDER — LIDOCAINE-EPINEPHRINE-TETRACAINE (LET) SOLUTION
3.0000 mL | Freq: Once | NASAL | Status: DC
  Filled 2019-11-19: qty 3

## 2019-11-19 MED ORDER — LIDOCAINE-EPINEPHRINE 1 %-1:100000 IJ SOLN
10.0000 mL | Freq: Once | INTRAMUSCULAR | Status: DC
  Filled 2019-11-19: qty 10

## 2019-11-19 NOTE — ED Notes (Signed)
Report given to [facility] staff, family at bedside updated,

## 2019-11-19 NOTE — ED Notes (Signed)
Non stick dressing applied to left eyebrow area, pt tolerated well, ems called for transport to [place]

## 2019-11-19 NOTE — Discharge Instructions (Addendum)
Your x-ray is normal, there is no signs of broken bones in your pelvis or your hips.  You have 5 stitches in your face, these can come out in 1 week  ER for severe or worsening symptoms.

## 2019-11-19 NOTE — ED Provider Notes (Signed)
[facility] EMERGENCY DEPARTMENT Provider Note   CSN: [id number] Arrival date & time: 11/19/19  1811     History Chief Complaint  Patient presents with  . [place] is a 84 y.o. female.  HPI   This patient is an 84 year old female, she has a known history of dementia, level 5 caveat applies as the patient is not able to give any history.  According to the nursing home staff and the nurse practitioner who covers this facility, she called me personally to tell me that this patient had had a fall in her bedroom, it was unwitnessed, she was found with a laceration above her left eyebrow.  She is not to be hospitalized as she is "do not hospitalize" according to the staff there.  They tried to use Steri-Strips to close the wound but it continued to bleed thus they were directed to send the patient to the emergency department.  They also noted that one of her legs might have been shortened and rotated while she was on the ground.  The paramedics note that this patient had normal-appearing legs, no complaints, bleeding was minimal to the left eyebrow but had ongoing mild bleed.  Vital signs overall unremarkable.  Past Medical History:  Diagnosis Date  . Bursitis disorder   . Depressive disorder, not elsewhere classified   . Hypertension   . Lumbago   . Morbid obesity ([place])   . Osteoarthrosis, unspecified whether generalized or localized, unspecified site   . Other and unspecified hyperlipidemia   . Pain in joint    INVOLVING LOWER LEG  . Reflux esophagitis   . Unspecified hypothyroidism   . Unspecified vitamin D deficiency     Patient Active Problem List   Diagnosis Date Noted  . Cognitive changes 01/03/2014  . Unspecified hereditary and idiopathic peripheral neuropathy 01/03/2014  . Positive urine drug screen 01/03/2014    Past Surgical History:  Procedure Laterality Date  . ABDOMINAL HYSTERECTOMY    . BACK SURGERY    . CHOLECYSTECTOMY    . COLONOSCOPY N/A  03/16/2014   Procedure: COLONOSCOPY;  Surgeon: Rogene Houston, MD;  Location[facility];  Service: Endoscopy;  Laterality: N/A;  1200 - moved to 7:30 - Ann to notify pt  . UNLISTED PROCEDURE     FOREARM/WRIST     OB History   No obstetric history on file.     Family History  Problem Relation Age of Onset  . Stroke Father   . Cancer Mother        BREAST  . Other Other        ARTHROPATHY, UNSPECIFIED SITE  . Hypertension Other   . Diabetes Other     Social History   Tobacco Use  . Smoking status: Never Smoker  . Smokeless tobacco: Never Used  Substance Use Topics  . Alcohol use: No  . Drug use: No    Home Medications Prior to Admission medications   Medication Sig Start Date End Date Taking? Authorizing Provider  donepezil (ARICEPT) 10 MG tablet Take 10 mg by mouth at bedtime.  11/28/13   [provider]  ipratropium (ATROVENT) 0.06 % nasal spray Place 1 spray into both nostrils 3 (three) times daily as needed for rhinitis.  11/17/13   [provider]  levothyroxine (SYNTHROID, LEVOTHROID) 75 MCG tablet Take 75 mcg by mouth daily before breakfast.  11/25/13   [provider]  loperamide (IMODIUM A-D) 2 MG tablet Take 0.5  tablets (1 mg total) by mouth daily as needed for diarrhea or loose stools. 03/16/14   Rogene Houston, MD  loratadine (CLARITIN) 10 MG tablet Take 10 mg by mouth daily.    [provider]  Multiple Vitamins-Minerals (CENTRUM SILVER PO) Take 1 tablet by mouth daily.     [provider]  omeprazole (PRILOSEC) 20 MG capsule Take 20 mg by mouth daily.  11/28/13   [provider]  PROAIR HFA 108 (90 BASE) MCG/ACT inhaler Inhale 1 puff into the lungs every 4 (four) hours as needed for shortness of breath.  10/03/13   [provider]  sertraline (ZOLOFT) 100 MG tablet Take 100 mg by mouth at bedtime.  11/25/13   [provider]    Allergies    Keflex [cephalexin]  Review of Systems   Review  of Systems  Unable to perform ROS: Dementia    Physical Exam Updated Vital Signs There were no vitals taken for this visit.  Physical Exam Vitals and nursing note reviewed.  Constitutional:      General: She is not in acute distress.    Appearance: She is well-developed.  HENT:     Head: Normocephalic.     Comments: 3 cm laceration linear overlying the left eyelid, this is in the eyebrow, mild bleeding    Mouth/Throat:     Mouth: Mucous membranes are moist.     Pharynx: No oropharyngeal exudate.     Comments: No malocclusion or tenderness over the facial bones Eyes:     General: No scleral icterus.       Right eye: No discharge.        Left eye: No discharge.     Conjunctiva/sclera: Conjunctivae normal.     Pupils: Pupils are equal, round, and reactive to light.  Neck:     Thyroid: No thyromegaly.     Vascular: No JVD.  Cardiovascular:     Rate and Rhythm: Normal rate and regular rhythm.     Heart sounds: Normal heart sounds. No murmur heard.  No friction rub. No gallop.   Pulmonary:     Effort: Pulmonary effort is normal. No respiratory distress.     Breath sounds: Normal breath sounds. No wheezing or rales.  Abdominal:     General: Bowel sounds are normal. There is no distension.     Palpations: Abdomen is soft. There is no mass.     Tenderness: There is no abdominal tenderness.  Musculoskeletal:        General: No tenderness. Normal range of motion.     Cervical back: Normal range of motion and neck supple.     Comments: Full range of motion of her bilateral legs, she is able to rotate internally and externally as well as flex and extend without any pain in her hips, there is no grimace, there is no leg length discrepancy  Lymphadenopathy:     Cervical: No cervical adenopathy.  Skin:    General: Skin is warm and dry.     Findings: No erythema or rash.     Comments: Laceration as described  Neurological:     Mental Status: She is alert.     Coordination:  Coordination normal.     Comments: The patient is confused as to, location and date but able to tell me her name, pleasantly follows commands  Psychiatric:        Behavior: Behavior normal.     ED Results / Procedures / Treatments  Labs (all labs ordered are listed, but only abnormal results are displayed) Labs Reviewed - No data to display  EKG None  Radiology DG Pelvis 1-2 Views  Result Date: 11/19/2019 CLINICAL DATA:  Unwitnessed fall at 1630 hours, LEFT hip pain EXAM: PELVIS - 1-2 VIEW COMPARISON:  None FINDINGS: Osseous demineralization. Mild narrowing of the hip joints. SI joint spaces preserved. No acute fracture, dislocation, or bone destruction. Degenerative disc disease changes at visualized lumbar spine. IMPRESSION: No acute abnormalities. Minimal degenerative changes of the hip joints. Degenerative disc disease changes lower lumbar spine. Electronically Signed   By: Lavonia Dana M.D.   On: 11/19/2019 19:43    Procedures Procedures (including critical care time)  Medications Ordered in ED Medications  lidocaine-EPINEPHrine (XYLOCAINE W/EPI) 1 %-1:100000 (with pres) injection 10 mL (has no administration in time range)  lidocaine-EPINEPHrine-tetracaine (LET) solution (has no administration in time range)  lidocaine (PF) (XYLOCAINE) 1 % injection (has no administration in time range)  povidone-iodine (BETADINE) 10 % external solution (has no administration in time range)  bacitracin ointment (has no administration in time range)    ED Course  I have reviewed the triage vital signs and the nursing notes.  Pertinent labs & imaging results that were available during my care of the patient were reviewed by me and considered in my medical decision making (see chart for details).    MDM Rules/Calculators/A&P                          Laceration needs primary repair, no further imaging would be indicated as this patient is not a surgical candidate, she does not seem to have  any significant signs of head injury, there is no nausea vomiting seizures or posturing, she has no signs of hip fracture either.  I have been asked to not do further evaluation such as admission on this patient, I have been asked to do a primary repair as this could not be done at the patient's facility.  Laceration repaired by PA Layden, please see separate note, x-ray negative for fractures of the pelvis, stable for discharge  Final Clinical Impression(s) / ED Diagnoses Final diagnoses:  Facial laceration, initial encounter    Rx / DC Orders ED Discharge Orders    None       Noemi Chapel, MD 11/19/19 2007

## 2019-11-19 NOTE — ED Provider Notes (Signed)
..  Laceration Repair  Date/Time: 11/19/2019 6:38 PM Performed by: Volanda Napoleon, PA-C Authorized by: Volanda Napoleon, PA-C   Consent:    Consent obtained:  Verbal and emergent situation   Alternatives discussed:  No treatment and delayed treatment Universal protocol:    Procedure explained and questions answered to patient or proxy's satisfaction: yes     Relevant documents present and verified: yes     Test results available and properly labeled: yes     Imaging studies available: yes     Required blood products, implants, devices, and special equipment available: yes     Site/side marked: yes     Immediately prior to procedure, a time out was called: yes     Patient identity confirmed:  Verbally with patient Anesthesia (see MAR for exact dosages):    Anesthesia method:  Local infiltration   Local anesthetic:  Lidocaine 1% w/o epi Laceration details:    Location:  Face   Face location:  Forehead   Length (cm):  3 Repair type:    Repair type:  Intermediate Pre-procedure details:    Preparation:  Patient was prepped and draped in usual sterile fashion Exploration:    Hemostasis achieved with:  Direct pressure   Wound exploration: wound explored through full range of motion     Wound extent: no foreign bodies/material noted   Treatment:    Area cleansed with:  Betadine   Amount of cleaning:  Standard   Irrigation solution:  Sterile saline   Irrigation method:  Syringe   Visualized foreign bodies/material removed: no   Skin repair:    Repair method:  Sutures   Suture size:  5-0   Suture material:  Prolene   Suture technique:  Simple interrupted   Number of sutures:  5 Approximation:    Approximation:  Close Post-procedure details:    Dressing:  Antibiotic ointment and non-adherent dressing   Patient tolerance of procedure:  Tolerated well, no immediate complications      Desma Mcgregor 11/19/19 1844    Noemi Chapel, MD 11/19/19 [phone]

## 2019-11-19 NOTE — ED Triage Notes (Signed)
Pt from [facility]. Unwitnessed fall at 1630. Having left hip pain. Was shortened and rotated while she was on the floor. Once staff got her in the bed, her leg was fine.

## 2019-11-20 NOTE — ED Notes (Signed)
Pt incontinent of urine, pt cleaned,

## 2020-10-27 ENCOUNTER — Emergency Department (HOSPITAL_COMMUNITY): Payer: Medicare Other

## 2020-10-27 ENCOUNTER — Other Ambulatory Visit: Payer: Self-pay

## 2020-10-27 ENCOUNTER — Encounter (HOSPITAL_COMMUNITY): Payer: Self-pay | Admitting: *Deleted

## 2020-10-27 ENCOUNTER — Emergency Department (HOSPITAL_COMMUNITY)
Admission: EM | Admit: 2020-10-27 | Discharge: 2020-10-27 | Disposition: A | Discharge status: Skilled Nursing Facility | Payer: Medicare Other | Attending: Emergency Medicine | Admitting: Emergency Medicine

## 2020-10-27 DIAGNOSIS — F0391 Unspecified dementia with behavioral disturbance: Secondary | ICD-10-CM | POA: Diagnosis not present

## 2020-10-27 DIAGNOSIS — I129 Hypertensive chronic kidney disease with stage 1 through stage 4 chronic kidney disease, or unspecified chronic kidney disease: Secondary | ICD-10-CM | POA: Diagnosis not present

## 2020-10-27 DIAGNOSIS — W19XXXA Unspecified fall, initial encounter: Secondary | ICD-10-CM

## 2020-10-27 DIAGNOSIS — J45909 Unspecified asthma, uncomplicated: Secondary | ICD-10-CM | POA: Insufficient documentation

## 2020-10-27 DIAGNOSIS — S0181XA Laceration without foreign body of other part of head, initial encounter: Secondary | ICD-10-CM | POA: Insufficient documentation

## 2020-10-27 DIAGNOSIS — S0993XA Unspecified injury of face, initial encounter: Secondary | ICD-10-CM | POA: Diagnosis present

## 2020-10-27 DIAGNOSIS — J441 Chronic obstructive pulmonary disease with (acute) exacerbation: Secondary | ICD-10-CM | POA: Diagnosis not present

## 2020-10-27 DIAGNOSIS — N189 Chronic kidney disease, unspecified: Secondary | ICD-10-CM | POA: Insufficient documentation

## 2020-10-27 DIAGNOSIS — E039 Hypothyroidism, unspecified: Secondary | ICD-10-CM | POA: Diagnosis not present

## 2020-10-27 DIAGNOSIS — W07XXXA Fall from chair, initial encounter: Secondary | ICD-10-CM | POA: Diagnosis not present

## 2020-10-27 DIAGNOSIS — Z23 Encounter for immunization: Secondary | ICD-10-CM | POA: Insufficient documentation

## 2020-10-27 HISTORY — DX: Unspecified asthma, uncomplicated: J45.909

## 2020-10-27 HISTORY — DX: Peripheral vascular disease, unspecified: I73.9

## 2020-10-27 HISTORY — DX: Major depressive disorder, single episode, unspecified: F32.9

## 2020-10-27 HISTORY — DX: Gastro-esophageal reflux disease without esophagitis: K21.9

## 2020-10-27 HISTORY — DX: Anxiety disorder, unspecified: F41.9

## 2020-10-27 HISTORY — DX: Chronic obstructive pulmonary disease, unspecified: J44.9

## 2020-10-27 HISTORY — DX: Chronic kidney disease, unspecified: N18.9

## 2020-10-27 HISTORY — DX: Schizoaffective disorder, depressive type: F25.1

## 2020-10-27 HISTORY — DX: Unspecified mood (affective) disorder: F39

## 2020-10-27 HISTORY — DX: Unspecified dementia, unspecified severity, with other behavioral disturbance: F03.918

## 2020-10-27 MED ORDER — TETANUS-DIPHTH-ACELL PERTUSSIS 5-2.5-18.5 LF-MCG/0.5 IM SUSY
0.5000 mL | PREFILLED_SYRINGE | Freq: Once | INTRAMUSCULAR | Status: AC
  Administered 2020-10-27: 14:00:00 0.5 mL via INTRAMUSCULAR
  Filled 2020-10-27: qty 0.5

## 2020-10-27 MED ORDER — LIDOCAINE HCL (PF) 1 % IJ SOLN
5.0000 mL | Freq: Once | INTRAMUSCULAR | Status: AC
  Administered 2020-10-27: 14:00:00 5 mL
  Filled 2020-10-27: qty 30

## 2020-10-27 NOTE — ED Provider Notes (Signed)
[facility] EMERGENCY DEPARTMENT Provider Note   CSN: [id number] Arrival date & time: 10/27/20  1120     History No chief complaint on file.   TASHANA HABERL is a 85 y.o. female.  HPI  HPI will be deferred due to level 5 caveat dementia  Patient with significant medical history of CKD, COPD, hypertension, presents via EMS from [place] facility after sustaining a fall.  Per EMS from nursing staff patient was asleep in chair and fell out from the chair onto her head.  There is no indication that patient lost consciousness, is not on anticoagulants.  Patient's sister was at bedside states she is at her baseline, states she has multiple falls in the past, states she can walk without any difficulty.  She states that the patient's not been endorsing pain at this time.   Made attempts to speak with nursing facility the did not answer my call.  Past Medical History:  Diagnosis Date   Anxiety    Asthma    Bursitis disorder    CKD (chronic kidney disease)    COPD (chronic obstructive pulmonary disease) (HCC)    Dementia with behavioral disturbance (HCC)    Depressive disorder, not elsewhere classified    GERD (gastroesophageal reflux disease)    Hypertension    Lumbago    MDD (major depressive disorder)    Morbid obesity (HCC)    Osteoarthrosis, unspecified whether generalized or localized, unspecified site    Other and unspecified hyperlipidemia    Pain in joint    INVOLVING LOWER LEG   PVD (peripheral vascular disease) (HCC)    Reflux esophagitis    Schizoaffective disorder, depressive type ([place])    Unspecified hypothyroidism    Unspecified mood (affective) disorder ([place])    Unspecified vitamin D deficiency     Patient Active Problem List   Diagnosis Date Noted   Cognitive changes 01/03/2014   Unspecified hereditary and idiopathic peripheral neuropathy 01/03/2014   Positive urine drug screen 01/03/2014    Past Surgical History:  Procedure Laterality Date    ABDOMINAL HYSTERECTOMY     BACK SURGERY     CHOLECYSTECTOMY     COLONOSCOPY N/A 03/16/2014   Procedure: COLONOSCOPY;  Surgeon: Rogene Houston, MD;  Location: [facility];  Service: Endoscopy;  Laterality: N/A;  1200 - moved to 7:30 - Ann to notify pt   UNLISTED PROCEDURE     FOREARM/WRIST     OB History   No obstetric history on file.     Family History  Problem Relation Age of Onset   Stroke Father    Cancer Mother        BREAST   Other Other        ARTHROPATHY, UNSPECIFIED SITE   Hypertension Other    Diabetes Other     Social History   Tobacco Use   Smoking status: Never   Smokeless tobacco: Never  Vaping Use   Vaping Use: Never used  Substance Use Topics   Alcohol use: No   Drug use: No    Home Medications Prior to Admission medications   Medication Sig Start Date End Date Taking? Authorizing Provider  donepezil (ARICEPT) 10 MG tablet Take 10 mg by mouth at bedtime.  11/28/13   [provider]  ipratropium (ATROVENT) 0.06 % nasal spray Place 1 spray into both nostrils 3 (three) times daily as needed for rhinitis.  11/17/13   [provider]  levothyroxine (SYNTHROID, LEVOTHROID) 75 MCG tablet Take  75 mcg by mouth daily before breakfast.  11/25/13   [provider]  loperamide (IMODIUM A-D) 2 MG tablet Take 0.5 tablets (1 mg total) by mouth daily as needed for diarrhea or loose stools. 03/16/14   Rogene Houston, MD  loratadine (CLARITIN) 10 MG tablet Take 10 mg by mouth daily.    [provider]  Multiple Vitamins-Minerals (CENTRUM SILVER PO) Take 1 tablet by mouth daily.     [provider]  omeprazole (PRILOSEC) 20 MG capsule Take 20 mg by mouth daily.  11/28/13   [provider]  PROAIR HFA 108 (90 BASE) MCG/ACT inhaler Inhale 1 puff into the lungs every 4 (four) hours as needed for shortness of breath.  10/03/13   [provider]  sertraline (ZOLOFT) 100 MG tablet Take 100 mg by mouth at bedtime.   11/25/13   [provider]    Allergies    Keflex [cephalexin]  Review of Systems   Review of Systems  Unable to perform ROS: Dementia   Physical Exam Updated Vital Signs BP (!) 150/62   Pulse 60   Temp 98.3 F (36.8 C) (Oral)   Resp 10   Ht 5\' 2"  (1.575 m)   Wt 85.7 kg   SpO2 98%   BMI 34.56 kg/m   Physical Exam Vitals and nursing note reviewed.  Constitutional:      General: She is not in acute distress.    Appearance: She is not ill-appearing.     Comments: Obese in a deconditioned state.  HENT:     Head: Normocephalic and atraumatic.     Comments: No gross deformities noted of the head.    Nose: No congestion.  Eyes:     Extraocular Movements: Extraocular movements intact.     Conjunctiva/sclera: Conjunctivae normal.     Pupils: Pupils are equal, round, and reactive to light.  Cardiovascular:     Rate and Rhythm: Normal rate and regular rhythm.     Pulses: Normal pulses.     Heart sounds: No murmur heard.   No friction rub. No gallop.     Comments: Chest nontender to palpation Pulmonary:     Effort: No respiratory distress.     Breath sounds: No wheezing, rhonchi or rales.  Abdominal:     Palpations: Abdomen is soft.     Tenderness: There is no abdominal tenderness.  Musculoskeletal:     Cervical back: No tenderness.     Comments: Spine was palpated was nontender to palpation no step-off or deformities present.  Hips were palpated they are nontender to palpation, knees were palpated nontender to palpation, there is no noted internal or external rotation of the lower extremities, no leg shortening.  Patient able to ambulate without difficulty.  Skin:    General: Skin is warm and dry.     Comments: Noted 5 cm laceration above the right eyebrow, hemodynamically stable, no foreign bodies present, no ligament tendon damage present.  Neurological:     Mental Status: She is alert.  Psychiatric:        Mood and Affect: Mood normal.    ED Results /  Procedures / Treatments   Labs (all labs ordered are listed, but only abnormal results are displayed) Labs Reviewed - No data to display  EKG None  Radiology CT Head Wo Contrast  Result Date: 10/27/2020 CLINICAL DATA:  Patient fell with laceration to right forehead. EXAM: CT HEAD WITHOUT CONTRAST TECHNIQUE: Contiguous axial images were obtained from the  base of the skull through the vertex without intravenous contrast. COMPARISON:  04/01/2019 FINDINGS: Brain: There is no evidence for acute hemorrhage, hydrocephalus, mass lesion, or abnormal extra-axial fluid collection. No definite CT evidence for acute infarction. Diffuse loss of parenchymal volume is consistent with atrophy. Patchy low attenuation in the deep hemispheric and periventricular white matter is nonspecific, but likely reflects chronic microvascular ischemic demyelination. Age indeterminate lacunar infarct noted right caudate head. Vascular: No hyperdense vessel or unexpected calcification. Skull: No evidence for fracture. No worrisome lytic or sclerotic lesion. Sinuses/Orbits: The visualized paranasal sinuses and mastoid air cells are clear. Visualized portions of the globes and intraorbital fat are unremarkable. Other: None. IMPRESSION: 1. No acute intracranial abnormality. 2. Atrophy with chronic small vessel white matter ischemic disease. 3. No skull fracture. Electronically Signed   By: Misty Stanley M.D.   On: 10/27/2020 13:15    Procedures .Marland KitchenLaceration Repair  Date/Time: 10/27/2020 2:54 PM Performed by: Marcello Fennel, PA-C Authorized by: Marcello Fennel, PA-C   Consent:    Consent obtained:  Verbal   Consent given by:  Patient and guardian   Risks discussed:  Infection, pain, retained foreign body, need for additional repair, poor cosmetic result, tendon damage, vascular damage, poor wound healing and nerve damage   Alternatives discussed:  No treatment Universal protocol:    Patient identity confirmed:  Verbally  with patient Anesthesia:    Anesthesia method:  Local infiltration   Local anesthetic:  Lidocaine 1% w/o epi Laceration details:    Location:  Face   Face location:  Forehead   Length (cm):  5   Depth (mm):  2 Pre-procedure details:    Preparation:  Patient was prepped and draped in usual sterile fashion and imaging obtained to evaluate for foreign bodies Exploration:    Limited defect created (wound extended): no     Hemostasis achieved with:  Direct pressure   Imaging obtained: x-ray     Imaging outcome: foreign body not noted     Wound exploration: wound explored through full range of motion and entire depth of wound visualized     Contaminated: no   Treatment:    Area cleansed with:  Saline   Amount of cleaning:  Standard   Irrigation method:  Syringe   Visualized foreign bodies/material removed: no   Skin repair:    Repair method:  Sutures   Suture size:  5-0   Suture material:  Prolene   Suture technique:  Simple interrupted   Number of sutures:  3 Approximation:    Approximation:  Close Repair type:    Repair type:  Simple Post-procedure details:    Dressing:  Non-adherent dressing   Procedure completion:  Tolerated well, no immediate complications   Medications Ordered in ED Medications  lidocaine (PF) (XYLOCAINE) 1 % injection 5 mL (5 mLs Infiltration Given 10/27/20 1410)  Tdap (BOOSTRIX) injection 0.5 mL (0.5 mLs Intramuscular Given 10/27/20 1406)    ED Course  I have reviewed the triage vital signs and the nursing notes.  Pertinent labs & imaging results that were available during my care of the patient were reviewed by me and considered in my medical decision making (see chart for details).    MDM Rules/Calculators/A&P                         Initial impression-patient presents after a fall.  She is alert, does not appear to be in acute stress, vital signs reassuring.  Will obtain CT head, update patient's tetanus shot recommend suturing for improved healing  of the laceration the right upper eyebrow.  Work-up-CT head negative for acute findings.  Reassessment-Will recommend suturing to decrease infection risk and to assist with the healing process.  Patient and sister were agreeable with this and tolerated the procedure well. She received 3 sutures.  Neurovascular was fully intact after the procedure.  Rule out- Low for intracranial head bleed and or mass as no acute findings seen on CT head.  Low suspicion for CVA as this patient is able to move all 4 extremities without difficulty, no focal deficits present on my exam, patient is at baseline per family member.  Low suspicion for spinal cord abnormality or spinal fracture as spine was palpated nontender to palpation, no step-off deformities present.  Low suspicion for rib fracture or pneumothorax lung sounds are clear bilaterally, chest was nontender to palpation.  Low suspicion for intra-abdominal trauma as abdomen soft nontender to palpation.  Low suspicion for hip fracture as there is no internal or external rotation of the legs, no leg shortening.  Patient is able to ambulate without difficulty.    Plan-  Head laceration-patient received 3 sutures, will defer antibiotic treatment as wound was thoroughly flushed out.  Will recommend basic wound care, follow back up in next 5 to 7 days to have sutures removed.  Vital signs have remained stable, no indication for hospital admission.  Patient discussed with attending and they agreed with assessment and plan.  Patient given at home care as well strict return precautions.  Patient verbalized that they understood agreed to said plan.  Final Clinical Impression(s) / ED Diagnoses Final diagnoses:  Fall, initial encounter  Laceration of forehead, initial encounter    Rx / DC Orders ED Discharge Orders     None        Marcello Fennel, PA-C 10/27/20 1500    Wyvonnia Dusky, MD 10/27/20 [phone]

## 2020-10-27 NOTE — ED Triage Notes (Signed)
Pt brought in by [facility] from [facility]. Pt was sitting in a chair and fell asleep causing her to fall out of her chair and now has a laceration to her forehead. No use of blood thinners. Denies LOC.

## 2020-10-27 NOTE — Discharge Instructions (Addendum)
Exam and imaging are reassuring.  Patient received 3 sutures on her right eyebrow.  Please abstain from getting the wound wet for the first 24 hours.  After that I would like you to rinse out the wound 2 times daily and change out the dressings.  Recommend over-the-counter pain medications as needed.  Must follow-up in the next 5 to 7 days to have sutures removed.  You may go to urgent care, PCP, ED.  Come back to the emergency department if you develop chest pain, shortness of breath, severe abdominal pain, uncontrolled nausea, vomiting, diarrhea.

## 2021-12-10 ENCOUNTER — Emergency Department (HOSPITAL_COMMUNITY)
Admission: EM | Admit: 2021-12-10 | Discharge: 2021-12-11 | Disposition: A | Discharge status: Home / Self Care | Payer: Medicare Other | Attending: Emergency Medicine | Admitting: Emergency Medicine

## 2021-12-10 ENCOUNTER — Emergency Department (HOSPITAL_COMMUNITY): Payer: Medicare Other

## 2021-12-10 ENCOUNTER — Other Ambulatory Visit: Payer: Self-pay

## 2021-12-10 DIAGNOSIS — F0390 Unspecified dementia without behavioral disturbance: Secondary | ICD-10-CM | POA: Diagnosis not present

## 2021-12-10 DIAGNOSIS — S0990XA Unspecified injury of head, initial encounter: Secondary | ICD-10-CM

## 2021-12-10 DIAGNOSIS — E039 Hypothyroidism, unspecified: Secondary | ICD-10-CM | POA: Insufficient documentation

## 2021-12-10 DIAGNOSIS — S60212A Contusion of left wrist, initial encounter: Secondary | ICD-10-CM

## 2021-12-10 DIAGNOSIS — R220 Localized swelling, mass and lump, head: Secondary | ICD-10-CM | POA: Insufficient documentation

## 2021-12-10 DIAGNOSIS — S022XXA Fracture of nasal bones, initial encounter for closed fracture: Secondary | ICD-10-CM | POA: Diagnosis not present

## 2021-12-10 DIAGNOSIS — S0511XA Contusion of eyeball and orbital tissues, right eye, initial encounter: Secondary | ICD-10-CM | POA: Diagnosis not present

## 2021-12-10 DIAGNOSIS — W19XXXA Unspecified fall, initial encounter: Secondary | ICD-10-CM

## 2021-12-10 DIAGNOSIS — S161XXA Strain of muscle, fascia and tendon at neck level, initial encounter: Secondary | ICD-10-CM

## 2021-12-10 DIAGNOSIS — S0992XA Unspecified injury of nose, initial encounter: Secondary | ICD-10-CM | POA: Diagnosis present

## 2021-12-10 DIAGNOSIS — W1809XA Striking against other object with subsequent fall, initial encounter: Secondary | ICD-10-CM | POA: Diagnosis not present

## 2021-12-10 DIAGNOSIS — S0083XA Contusion of other part of head, initial encounter: Secondary | ICD-10-CM

## 2021-12-10 DIAGNOSIS — S39012A Strain of muscle, fascia and tendon of lower back, initial encounter: Secondary | ICD-10-CM

## 2021-12-10 NOTE — ED Notes (Signed)
Patient transported to CT 

## 2021-12-10 NOTE — Discharge Instructions (Signed)
Continue medications as previously prescribed.  Apply ice to the forehead and bridge of the nose for 20 minutes every 2 hours while awake for the next 2 days.  Return to the emergency for any new and/or concerning issues.

## 2021-12-10 NOTE — ED Notes (Signed)
Pts sister, Regino Schultze would like an update on pts disposition. Number to call listed in chart.

## 2021-12-10 NOTE — ED Notes (Signed)
[facility] notified of patient needing transportation back to [facility].

## 2021-12-10 NOTE — ED Provider Notes (Signed)
[facility] EMERGENCY DEPARTMENT Provider Note   CSN: [id number] Arrival date & time: 12/10/21  1833     History  Chief Complaint  Patient presents with   Cassie Hanson is a 86 y.o. female.  Patient is an 86 year old female with past medical history of dementia, hypothyroidism, and GERD.  Patient sent from her extended care facility for evaluation of a fall.  From what I am told patient fell and struck the right side of her head.  She has swelling around the right eye, the right forehead, and nose.  There was no reported loss of consciousness.  Patient transported here by EMS.  Patient is nonverbal and adds no additional history.  The history is provided by the EMS personnel and the nursing home.       Home Medications Prior to Admission medications   Medication Sig Start Date End Date Taking? Authorizing Provider  busPIRone (BUSPAR) 10 MG tablet Take 10 mg by mouth 2 (two) times daily. 10/18/21  Yes [provider]  Cholecalciferol (VITAMIN D3) 50 MCG (2000 UT) TABS Take 1 tablet by mouth daily.   Yes [provider]  levothyroxine (SYNTHROID) 100 MCG tablet Take by mouth. 10/18/21  Yes [provider]  mirtazapine (REMERON) 7.5 MG tablet Take by mouth. 10/18/21  Yes [provider]  Multiple Vitamins-Minerals (CENTRUM SILVER PO) Take 1 tablet by mouth daily.    Yes [provider]  omeprazole (PRILOSEC) 20 MG capsule Take 20 mg by mouth daily.  11/28/13  Yes [provider]  sertraline (ZOLOFT) 25 MG tablet Take 25 mg by mouth daily. 10/18/21  Yes [provider]      Allergies    Keflex [cephalexin]    Review of Systems   Review of Systems  Unable to perform ROS: Dementia    Physical Exam Updated Vital Signs BP (!) 147/65   Pulse 66   Temp 98.8 F (37.1 C) (Axillary)   Resp 11   SpO2 97%  Physical Exam Vitals and nursing note reviewed.  Constitutional:      General: She is not in acute  distress.    Appearance: She is well-developed. She is not diaphoretic.  HENT:     Head: Normocephalic.     Comments: There is swelling to the right forehead as well as a periorbital ecchymosis on the right.      Nose:     Comments: There is swelling to the bridge of the nose and dried blood in the nares, but septum is midline with no hematoma. Eyes:     Extraocular Movements: Extraocular movements intact.     Pupils: Pupils are equal, round, and reactive to light.  Cardiovascular:     Rate and Rhythm: Normal rate and regular rhythm.     Heart sounds: No murmur heard.    No friction rub. No gallop.  Pulmonary:     Effort: Pulmonary effort is normal. No respiratory distress.     Breath sounds: Normal breath sounds. No wheezing.  Abdominal:     General: Bowel sounds are normal. There is no distension.     Palpations: Abdomen is soft.     Tenderness: There is no abdominal tenderness.  Musculoskeletal:        General: Normal range of motion.     Cervical back: Normal range of motion and neck supple.     Comments: Patient has good range of motion of both hips with no apparent discomfort.  Skin:    General: Skin is warm and dry.  Neurological:     General: No focal deficit present.     Mental Status: She is alert.     Cranial Nerves: No cranial nerve deficit.     Motor: No weakness.     ED Results / Procedures / Treatments   Labs (all labs ordered are listed, but only abnormal results are displayed) Labs Reviewed - No data to display  EKG None  Radiology DG Wrist Complete Left  Result Date: 12/10/2021 CLINICAL DATA:  Left wrist pain and bruising following fall, initial encounter EXAM: LEFT WRIST - COMPLETE 3+ VIEW COMPARISON:  None Available. FINDINGS: Widening of the scapholunate space is noted consistent with ligamentous injury likely of a chronic nature. Degenerative changes of the first [facility] joint are seen. No acute fracture or dislocation is seen. No soft tissue  abnormality is noted. IMPRESSION: Degenerative changes without acute abnormality. Widening of the scapholunate space is seen as described. Electronically Signed   By: Inez Catalina M.D.   On: 12/10/2021 22:40   DG Lumbar Spine Complete  Result Date: 12/10/2021 CLINICAL DATA:  History of recent fall with back pain, initial encounter EXAM: LUMBAR SPINE - COMPLETE 4+ VIEW COMPARISON:  None Available. FINDINGS: Five lumbar type vertebral bodies are well visualized. Multilevel osteophytic changes and facet hypertrophic changes are seen. No compression deformity is noted. No anterolisthesis is seen. Multilevel disc space narrowing is noted. Surrounding soft tissue structures are within normal limits. IMPRESSION: Multilevel degenerative change without acute abnormality. Electronically Signed   By: Inez Catalina M.D.   On: 12/10/2021 22:39   CT Head Wo Contrast  Result Date: 12/10/2021 CLINICAL DATA:  Neck trauma (Age >= 65y); Head trauma, minor (Age >= 65y). Nursing home reported to EMS that pt fell and hit the right side of her head. A bluish hematoma is visible. EXAM: CT HEAD WITHOUT CONTRAST CT CERVICAL SPINE WITHOUT CONTRAST TECHNIQUE: Multidetector CT imaging of the head and cervical spine was performed following the standard protocol without intravenous contrast. Multiplanar CT image reconstructions of the cervical spine were also generated. RADIATION DOSE REDUCTION: This exam was performed according to the departmental dose-optimization program which includes automated exposure control, adjustment of the mA and/or kV according to patient size and/or use of iterative reconstruction technique. COMPARISON:  CT head and cervical spine 04/01/2019 FINDINGS: CT HEAD FINDINGS BRAIN: BRAIN Cerebral ventricle sizes are concordant with the degree of cerebral volume loss. Patchy and confluent areas of decreased attenuation are noted throughout the deep and periventricular white matter of the cerebral hemispheres  bilaterally, compatible with chronic microvascular ischemic disease. No evidence of large-territorial acute infarction. No parenchymal hemorrhage. No mass lesion. No extra-axial collection. No mass effect or midline shift. No hydrocephalus. Basilar cisterns are patent. Vascular: No hyperdense vessel. Atherosclerotic calcifications are present within the cavernous internal carotid arteries. Skull: Acute comminuted right nasal bone fracture. Otherwise no acute fracture or focal lesion. Sinuses/Orbits: Paranasal sinuses and mastoid air cells are clear. Bilateral lens replacement. Otherwise the orbits are unremarkable. Other: Right periorbital subcutaneus soft tissue hematoma. Marked scalp 7 mm subcutaneus soft tissue hematoma. CT CERVICAL SPINE FINDINGS Alignment: Grade 1 anterolisthesis of C3 on C4. Skull base and vertebrae: Multilevel degenerative changes of the spine. No associated severe osseous neural foraminal or central canal stenosis. No acute fracture. No aggressive appearing focal osseous lesion or focal pathologic process. Soft tissues and spinal canal: No prevertebral fluid or swelling. No visible canal hematoma. Upper chest: Unremarkable.  Other: Atherosclerotic plaque of the carotid arteries within neck. IMPRESSION: 1. No acute intracranial abnormality. 2. No acute displaced fracture or traumatic listhesis of the cervical spine. 3. Acute comminuted right nasal bone fracture. Electronically Signed   By: Iven Finn M.D.   On: 12/10/2021 22:23   CT Cervical Spine Wo Contrast  Result Date: 12/10/2021 CLINICAL DATA:  Neck trauma (Age >= 65y); Head trauma, minor (Age >= 65y). Nursing home reported to EMS that pt fell and hit the right side of her head. A bluish hematoma is visible. EXAM: CT HEAD WITHOUT CONTRAST CT CERVICAL SPINE WITHOUT CONTRAST TECHNIQUE: Multidetector CT imaging of the head and cervical spine was performed following the standard protocol without intravenous contrast. Multiplanar CT  image reconstructions of the cervical spine were also generated. RADIATION DOSE REDUCTION: This exam was performed according to the departmental dose-optimization program which includes automated exposure control, adjustment of the mA and/or kV according to patient size and/or use of iterative reconstruction technique. COMPARISON:  CT head and cervical spine 04/01/2019 FINDINGS: CT HEAD FINDINGS BRAIN: BRAIN Cerebral ventricle sizes are concordant with the degree of cerebral volume loss. Patchy and confluent areas of decreased attenuation are noted throughout the deep and periventricular white matter of the cerebral hemispheres bilaterally, compatible with chronic microvascular ischemic disease. No evidence of large-territorial acute infarction. No parenchymal hemorrhage. No mass lesion. No extra-axial collection. No mass effect or midline shift. No hydrocephalus. Basilar cisterns are patent. Vascular: No hyperdense vessel. Atherosclerotic calcifications are present within the cavernous internal carotid arteries. Skull: Acute comminuted right nasal bone fracture. Otherwise no acute fracture or focal lesion. Sinuses/Orbits: Paranasal sinuses and mastoid air cells are clear. Bilateral lens replacement. Otherwise the orbits are unremarkable. Other: Right periorbital subcutaneus soft tissue hematoma. Marked scalp 7 mm subcutaneus soft tissue hematoma. CT CERVICAL SPINE FINDINGS Alignment: Grade 1 anterolisthesis of C3 on C4. Skull base and vertebrae: Multilevel degenerative changes of the spine. No associated severe osseous neural foraminal or central canal stenosis. No acute fracture. No aggressive appearing focal osseous lesion or focal pathologic process. Soft tissues and spinal canal: No prevertebral fluid or swelling. No visible canal hematoma. Upper chest: Unremarkable. Other: Atherosclerotic plaque of the carotid arteries within neck. IMPRESSION: 1. No acute intracranial abnormality. 2. No acute displaced  fracture or traumatic listhesis of the cervical spine. 3. Acute comminuted right nasal bone fracture. Electronically Signed   By: Iven Finn M.D.   On: 12/10/2021 22:23    Procedures Procedures    Medications Ordered in ED Medications - No data to display  ED Course/ Medical Decision Making/ A&P  Patient presenting here after a fall at her extended care facility.  She has history of dementia and adds no additional history.  She moves all extremities and appears to be at her reported baseline.  She has swelling to the forehead, bruising around the right eye, and swelling to the bridge of the nose.  Imaging studies of the head and face reveal a nasal bone fracture, but no other facial fractures and head CT is otherwise negative.  Cervical spine CT is negative.  X-rays of the lumbar spine and left wrist are also not diagnostic of fracture.  She has good range of motion of the left wrist despite findings of widening of the scapholunate space as described by the radiology interpretation.  At this point, I feel as though patient can safely be discharged.  Extended-care facility is to adhere to fall precautions and return as needed for any problems.  Final Clinical Impression(s) / ED Diagnoses Final diagnoses:  None    Rx / DC Orders ED Discharge Orders     None         Veryl Speak, MD 12/10/21 2339

## 2021-12-10 NOTE — ED Notes (Signed)
Pt noted to have multiple bruises to both arms in different stages of healing. Pt also has hematoma to left eye as well as blood on and inside of nares.

## 2021-12-10 NOTE — ED Triage Notes (Signed)
Facility reported to EMS that pt fell and hit the right side of her head. A bluish hematoma is visible.

## 2021-12-11 NOTE — ED Notes (Signed)
This RN contacted pts sister to give update on disposition per request.

## 2022-01-02 ENCOUNTER — Other Ambulatory Visit: Payer: Self-pay

## 2022-01-02 ENCOUNTER — Inpatient Hospital Stay (HOSPITAL_COMMUNITY)
Admission: EM | Admit: 2022-01-02 | Discharge: 2022-01-05 | DRG: 177 | Disposition: A | Discharge status: Skilled Nursing Facility | Payer: Medicare Other | Source: Skilled Nursing Facility | Attending: Internal Medicine | Admitting: Internal Medicine

## 2022-01-02 ENCOUNTER — Emergency Department (HOSPITAL_COMMUNITY): Payer: Medicare Other

## 2022-01-02 ENCOUNTER — Encounter (HOSPITAL_COMMUNITY): Payer: Self-pay

## 2022-01-02 DIAGNOSIS — I739 Peripheral vascular disease, unspecified: Secondary | ICD-10-CM | POA: Diagnosis present

## 2022-01-02 DIAGNOSIS — J449 Chronic obstructive pulmonary disease, unspecified: Secondary | ICD-10-CM | POA: Diagnosis present

## 2022-01-02 DIAGNOSIS — Z8249 Family history of ischemic heart disease and other diseases of the circulatory system: Secondary | ICD-10-CM

## 2022-01-02 DIAGNOSIS — I10 Essential (primary) hypertension: Secondary | ICD-10-CM

## 2022-01-02 DIAGNOSIS — Z79899 Other long term (current) drug therapy: Secondary | ICD-10-CM | POA: Diagnosis not present

## 2022-01-02 DIAGNOSIS — Z881 Allergy status to other antibiotic agents status: Secondary | ICD-10-CM | POA: Diagnosis not present

## 2022-01-02 DIAGNOSIS — Z79890 Hormone replacement therapy (postmenopausal): Secondary | ICD-10-CM

## 2022-01-02 DIAGNOSIS — Z9049 Acquired absence of other specified parts of digestive tract: Secondary | ICD-10-CM

## 2022-01-02 DIAGNOSIS — J690 Pneumonitis due to inhalation of food and vomit: Secondary | ICD-10-CM | POA: Diagnosis not present

## 2022-01-02 DIAGNOSIS — Z66 Do not resuscitate: Secondary | ICD-10-CM | POA: Diagnosis present

## 2022-01-02 DIAGNOSIS — Z803 Family history of malignant neoplasm of breast: Secondary | ICD-10-CM | POA: Diagnosis not present

## 2022-01-02 DIAGNOSIS — E039 Hypothyroidism, unspecified: Secondary | ICD-10-CM | POA: Diagnosis present

## 2022-01-02 DIAGNOSIS — Z90710 Acquired absence of both cervix and uterus: Secondary | ICD-10-CM

## 2022-01-02 DIAGNOSIS — I129 Hypertensive chronic kidney disease with stage 1 through stage 4 chronic kidney disease, or unspecified chronic kidney disease: Secondary | ICD-10-CM | POA: Diagnosis present

## 2022-01-02 DIAGNOSIS — E785 Hyperlipidemia, unspecified: Secondary | ICD-10-CM | POA: Diagnosis present

## 2022-01-02 DIAGNOSIS — F03C4 Unspecified dementia, severe, with anxiety: Secondary | ICD-10-CM | POA: Diagnosis present

## 2022-01-02 DIAGNOSIS — F0390 Unspecified dementia without behavioral disturbance: Secondary | ICD-10-CM

## 2022-01-02 DIAGNOSIS — F251 Schizoaffective disorder, depressive type: Secondary | ICD-10-CM | POA: Diagnosis present

## 2022-01-02 DIAGNOSIS — F419 Anxiety disorder, unspecified: Secondary | ICD-10-CM

## 2022-01-02 DIAGNOSIS — Z833 Family history of diabetes mellitus: Secondary | ICD-10-CM

## 2022-01-02 DIAGNOSIS — Z823 Family history of stroke: Secondary | ICD-10-CM

## 2022-01-02 DIAGNOSIS — K219 Gastro-esophageal reflux disease without esophagitis: Secondary | ICD-10-CM | POA: Diagnosis present

## 2022-01-02 DIAGNOSIS — Z20822 Contact with and (suspected) exposure to covid-19: Secondary | ICD-10-CM | POA: Diagnosis present

## 2022-01-02 DIAGNOSIS — J9601 Acute respiratory failure with hypoxia: Secondary | ICD-10-CM | POA: Diagnosis not present

## 2022-01-02 DIAGNOSIS — F32A Depression, unspecified: Secondary | ICD-10-CM

## 2022-01-02 LAB — BLOOD GAS, VENOUS
Acid-Base Excess: 2.5 mmol/L — ABNORMAL HIGH (ref 0.0–2.0)
Bicarbonate: 28.8 mmol/L — ABNORMAL HIGH (ref 20.0–28.0)
Drawn by: 6892
O2 Saturation: 27.8 %
Patient temperature: 36.7
pCO2, Ven: 50 mmHg (ref 44–60)
pH, Ven: 7.36 (ref 7.25–7.43)
pO2, Ven: <31 mmHg — CL (ref 32–45)

## 2022-01-02 LAB — COMPREHENSIVE METABOLIC PANEL
ALT: 24 U/L (ref 0–44)
AST: 38 U/L (ref 15–41)
Albumin: 3.9 g/dL (ref 3.5–5.0)
Alkaline Phosphatase: 69 U/L (ref 38–126)
Anion gap: 8 (ref 5–15)
BUN: 36 mg/dL — ABNORMAL HIGH (ref 8–23)
CO2: 24 mmol/L (ref 22–32)
Calcium: 9 mg/dL (ref 8.9–10.3)
Chloride: 110 mmol/L (ref 98–111)
Creatinine, Ser: 1.11 mg/dL — ABNORMAL HIGH (ref 0.44–1.00)
GFR, Estimated: 49 mL/min — ABNORMAL LOW (ref >60)
Glucose, Bld: 120 mg/dL — ABNORMAL HIGH (ref 70–99)
Potassium: 4.4 mmol/L (ref 3.5–5.1)
Sodium: 142 mmol/L (ref 135–145)
Total Bilirubin: 0.9 mg/dL (ref 0.3–1.2)
Total Protein: 7.6 g/dL (ref 6.5–8.1)

## 2022-01-02 LAB — RESP PANEL BY RT-PCR (FLU A&B, COVID) ARPGX2
Influenza A by PCR: NEGATIVE
Influenza B by PCR: NEGATIVE
SARS Coronavirus 2 by RT PCR: NEGATIVE

## 2022-01-02 LAB — CBC WITH DIFFERENTIAL/PLATELET
Abs Immature Granulocytes: 0.04 10*3/uL (ref 0.00–0.07)
Basophils Absolute: 0 10*3/uL (ref 0.0–0.1)
Basophils Relative: 0 %
Eosinophils Absolute: 0 10*3/uL (ref 0.0–0.5)
Eosinophils Relative: 0 %
HCT: 37.6 % (ref 36.0–46.0)
Hemoglobin: 12 g/dL (ref 12.0–15.0)
Immature Granulocytes: 0 %
Lymphocytes Relative: 10 %
Lymphs Abs: 1.1 10*3/uL (ref 0.7–4.0)
MCH: 29.4 pg (ref 26.0–34.0)
MCHC: 31.9 g/dL (ref 30.0–36.0)
MCV: 92.2 fL (ref 80.0–100.0)
Monocytes Absolute: 0.3 10*3/uL (ref 0.1–1.0)
Monocytes Relative: 3 %
Neutro Abs: 9.6 10*3/uL — ABNORMAL HIGH (ref 1.7–7.7)
Neutrophils Relative %: 87 %
Platelets: 174 10*3/uL (ref 150–400)
RBC: 4.08 MIL/uL (ref 3.87–5.11)
RDW: 13.4 % (ref 11.5–15.5)
WBC: 11.1 10*3/uL — ABNORMAL HIGH (ref 4.0–10.5)
nRBC: 0 % (ref 0.0–0.2)

## 2022-01-02 LAB — LACTIC ACID, PLASMA
Lactic Acid, Venous: 1.6 mmol/L (ref 0.5–1.9)
Lactic Acid, Venous: 1.7 mmol/L (ref 0.5–1.9)
Lactic Acid, Venous: 1.1 mmol/L (ref 0.5–1.9)

## 2022-01-02 MED ORDER — SODIUM CHLORIDE 0.9 % IV SOLN
3.0000 g | Freq: Four times a day (QID) | INTRAVENOUS | Status: DC
  Administered 2022-01-02 – 2022-01-05 (×11): 3 g via INTRAVENOUS
  Filled 2022-01-02 (×12): qty 8

## 2022-01-02 MED ORDER — SODIUM CHLORIDE 0.9 % IV SOLN
500.0000 mg | INTRAVENOUS | Status: DC
  Administered 2022-01-03 – 2022-01-04 (×3): 500 mg via INTRAVENOUS
  Filled 2022-01-02 (×3): qty 5

## 2022-01-02 MED ORDER — IOHEXOL 350 MG/ML SOLN
75.0000 mL | Freq: Once | INTRAVENOUS | Status: AC | PRN
  Administered 2022-01-02: 75 mL via INTRAVENOUS

## 2022-01-02 NOTE — Progress Notes (Signed)
Pharmacy Antibiotic Note  Cassie Hanson is a 86 y.o. female admitted on 01/02/2022 with pneumonia.  Pharmacy has been consulted for unasyn dosing.  Plan: Unasyn 3 grams iv q6h  Height: '5\' 4"'$  (162.6 cm) (per SNF paperwork) Weight: 58.1 kg (128 lb) IBW/kg (Calculated) : 54.7  Temp (24hrs), Avg:98.1 F (36.7 C), Min:98.1 F (36.7 C), Max:98.1 F (36.7 C)  Recent Labs  Lab 01/02/22 2035 01/02/22 2118 01/02/22 2144  WBC  --   --  11.1*  CREATININE  --   --  1.11*  LATICACIDVEN 1.6 1.7  --     Estimated Creatinine Clearance: 33.2 mL/min (A) (by C-G formula based on SCr of 1.11 mg/dL (H)).    Allergies  Allergen Reactions   Keflex [Cephalexin]     Antimicrobials this admission: unasyn 09/07 >>    She has an order from 2015 for amoxicillin. Spoke with the provider and communicated this information.   Thank you for allowing pharmacy to be a part of this patient's care.  Vaughan Basta BS, PharmD, BCPS Clinical Pharmacist 01/02/2022 10:18 PM  Contact: [phone] after 3 PM  "Be curious, not judgmental..." -Jamal Maes

## 2022-01-02 NOTE — ED Triage Notes (Signed)
Pt BIB [facility] from [facility], called out for "choking". Upon arrival choking episode had resolved. Pt has hx of severe dementia, falls, and COPD, pt was not on oxygen at facility per EMS, but unable to obtain O2 sats due to pt fingers being cold. Pt was placed on 4 L [place] by EMS- pt has DNR with her. Staff told EMS that pt is on puree diet and got choked while eating, EMS reports lung sounds are junky, suspecting aspiration PNA.

## 2022-01-02 NOTE — ED Notes (Signed)
Lab called, unalbe to find blood that was taken to lab earlier when IV access was started- phlebotomist at bedside to redraw labs-

## 2022-01-02 NOTE — ED Notes (Signed)
Pt family at bedside

## 2022-01-02 NOTE — ED Provider Notes (Signed)
[facility] EMERGENCY DEPARTMENT Provider Note   CSN: [id number] Arrival date & time: 01/02/22  1915     History  Chief Complaint  Patient presents with   Choking    Now resolved    Cassie Hanson is a 86 y.o. female.  86 year old female presents today for concern of aspiration.  This is patient's second episode within the past 24 hours.  Patient became hypoxic.  On supplemental O2 now.  At baseline patient is ambulatory.  Does not converse at baseline secondary to severe dementia.  Patient has been slowly deteriorating since last night.  The history is provided by the patient. No language interpreter was used.       Home Medications Prior to Admission medications   Medication Sig Start Date End Date Taking? Authorizing Provider  busPIRone (BUSPAR) 10 MG tablet Take 10 mg by mouth 2 (two) times daily. 10/18/21   [provider]  Cholecalciferol (VITAMIN D3) 50 MCG (2000 UT) TABS Take 1 tablet by mouth daily.    [provider]  levothyroxine (SYNTHROID) 100 MCG tablet Take by mouth. 10/18/21   [provider]  mirtazapine (REMERON) 7.5 MG tablet Take by mouth. 10/18/21   [provider]  Multiple Vitamins-Minerals (CENTRUM SILVER PO) Take 1 tablet by mouth daily.     [provider]  omeprazole (PRILOSEC) 20 MG capsule Take 20 mg by mouth daily.  11/28/13   [provider]  sertraline (ZOLOFT) 25 MG tablet Take 25 mg by mouth daily. 10/18/21   [provider]      Allergies    Keflex [cephalexin]    Review of Systems   Review of Systems  Unable to perform ROS: Dementia    Physical Exam Updated Vital Signs BP (!) 134/48   Pulse 70   Temp 98.1 F (36.7 C) (Axillary)   Resp 20   Ht '5\' 4"'$  (1.626 m) Comment: per SNF paperwork  Wt 58.1 kg   SpO2 90%   BMI 21.97 kg/m  Physical Exam Vitals and nursing note reviewed.  Constitutional:      General: She is not in acute distress.    Appearance: Normal appearance.  She is not ill-appearing.  HENT:     Head: Normocephalic and atraumatic.     Nose: Nose normal.  Eyes:     Conjunctiva/sclera: Conjunctivae normal.  Cardiovascular:     Rate and Rhythm: Normal rate and regular rhythm.  Pulmonary:     Effort: Pulmonary effort is normal. No respiratory distress.     Breath sounds: Rales present. No wheezing.  Abdominal:     General: There is no distension.     Palpations: Abdomen is soft.     Tenderness: There is no abdominal tenderness. There is no guarding.  Musculoskeletal:        General: No deformity.  Skin:    Findings: No rash.  Neurological:     Mental Status: She is alert.     ED Results / Procedures / Treatments   Labs (all labs ordered are listed, but only abnormal results are displayed) Labs Reviewed  RESP PANEL BY RT-PCR (FLU A&B, COVID) ARPGX2  CBC WITH DIFFERENTIAL/PLATELET  BLOOD GAS, VENOUS  COMPREHENSIVE METABOLIC PANEL  LACTIC ACID, PLASMA  LACTIC ACID, PLASMA    EKG None  Radiology DG Chest Portable 1 View  Result Date: 01/02/2022 CLINICAL DATA:  Possible aspiration. EXAM: PORTABLE CHEST 1 VIEW COMPARISON:  None Available. FINDINGS: The heart size and mediastinal contours are within  normal limits. Mild to moderate severity diffuse, chronic appearing increased interstitial lung markings are seen. Mild to moderate severity areas of atelectasis and/or infiltrate are seen within the bilateral lung bases. There is no evidence of a pleural effusion or pneumothorax. Multilevel degenerative changes are noted throughout the thoracic spine. IMPRESSION: Chronic appearing increased interstitial lung markings with mild to moderate severity bibasilar atelectasis and/or infiltrate. Electronically Signed   By: Virgina Norfolk M.D.   On: 01/02/2022 20:19    Procedures Procedures    Medications Ordered in ED Medications - No data to display  ED Course/ Medical Decision Making/ A&P Clinical Course as of 01/02/22 2324  Thu Jan 02, 2022  2316 O2 Flow Rate (L/min): 4 L/min [AA]    Clinical Course User Index [AA] Evlyn Courier, PA-C                           Medical Decision Making Amount and/or Complexity of Data Reviewed Labs: ordered. Radiology: ordered.  Risk Prescription drug management. Decision regarding hospitalization.   86 year old female presents for concern of aspiration pneumonia.  Had 2 witnessed choking episodes 1 last night 1 today.  Has slowly deteriorated since the episode yesterday.  Patient with baseline severe dementia and does not provide any history. Daughter-in-law who later presents to bedside also is not able to provide much history.  At baseline patient is able to ambulate however significantly confused and does not converse.  Chest x-ray without acute cardiopulmonary process.  VBG without acute findings.  CBC mild leukocytosis, with left shift.  CMP overall without significant acute findings other than mild renal insufficiency.  Lactic acid of 1.7.  CT head without acute intracranial findings.  CT angio chest shows aspiration pneumonia.  No other acute findings.  EKG without acute ischemic changes.  Patient discussed with hospitalist who will evaluate patient for admission.  Final Clinical Impression(s) / ED Diagnoses Final diagnoses:  Aspiration pneumonia, unspecified aspiration pneumonia type, unspecified laterality, unspecified part of lung [facility])    Rx / DC Orders ED Discharge Orders     None         Evlyn Courier, PA-C 01/02/22 2326    Elgie Congo, MD 01/03/22 0201

## 2022-01-02 NOTE — H&P (Signed)
History and Physical    Patient: Cassie Hanson SNK:[id number] DOB: [birth year] DOA: 01/02/2022 DOS: the patient was seen and examined on 01/03/2022 PCP: Sinda Du, MD  Patient coming from: ALF/ILF  Chief Complaint:  Chief Complaint  Patient presents with   Choking    Now resolved   HPI: Cassie Hanson is a 86 y.o. female with medical history significant of dementia, hypertension, GERD, anxiety and depression who presents to the emergency department via EMS due to aspiration.  Patient was lethargic, but was arousable and was alert and oriented only to self.  History was unable to be obtained from patient, history was obtained from ED PA and ED medical record.  Per report, patient was on a pured diet and was suspected to be having a choking episode, so EMS was activated, but prior to arrival of the EMS team, the choking episode already resolved.  She was noted to be hypoxic and was provided with supplemental oxygen via [place] at 4 LPM by EMS team, she was then taken to the ED for further evaluation and management.  Patient ambulates with a walker at baseline at the facility per daughter-in-law at bedside.  ED Course:  In the emergency department, BP was 134/48 and other vital signs were within normal range, O2 sat ranged within 90 to 100% on supplemental oxygen at 4 LPM.  Work-up in the ED showed normal CBC except for WBC at 11.1, BMP was normal except for CBG of 120 and BUNs/creatinine at 36/1.11, lactic acid x3 was negative.  Influenza A, B, SARS coronavirus 2 was negative. CT head without contrast showed no acute intracranial abnormality CT angiography of chest with contrast showed: 1. No evidence of pulmonary embolism at the central and proximal segmental levels. Limited evaluation more distally due to motion artifact and lung disease. 2. Peribronchovascular patchy airspace opacities suggestive of infection/inflammation. 3. Small hiatal hernia. 4. Aortic Atherosclerosis (ICD10-I70.0) including  left anterior descending coronary artery and mitral annular calcifications. Chest x-ray showed chronic appearing increased interstitial lung markings with mild to moderate severity bibasilar atelectasis and/or infiltrate. Patient was started on IV Unasyn and azithromycin.  Hospitalist was asked to admit patient for further evaluation and management.  Review of Systems: Review of systems as noted in the HPI. All other systems reviewed and are negative.   Past Medical History:  Diagnosis Date   Anxiety    Asthma    Bursitis disorder    CKD (chronic kidney disease)    COPD (chronic obstructive pulmonary disease) (HCC)    Dementia with behavioral disturbance (HCC)    Depressive disorder, not elsewhere classified    GERD (gastroesophageal reflux disease)    Hypertension    Lumbago    MDD (major depressive disorder)    Morbid obesity (HCC)    Osteoarthrosis, unspecified whether generalized or localized, unspecified site    Other and unspecified hyperlipidemia    Pain in joint    INVOLVING LOWER LEG   PVD (peripheral vascular disease) (HCC)    Reflux esophagitis    Schizoaffective disorder, depressive type ([place])    Unspecified hypothyroidism    Unspecified mood (affective) disorder ([place])    Unspecified vitamin D deficiency    Past Surgical History:  Procedure Laterality Date   ABDOMINAL HYSTERECTOMY     BACK SURGERY     CHOLECYSTECTOMY     COLONOSCOPY N/A 03/16/2014   Procedure: COLONOSCOPY;  Surgeon: Rogene Houston, MD;  Location: [facility];  Service: Endoscopy;  Laterality: N/A;  1200 - moved to 7:30 - Ann to notify pt   UNLISTED PROCEDURE     FOREARM/WRIST    Social History:  reports that she has never smoked. She has never used smokeless tobacco. She reports that she does not drink alcohol and does not use drugs.   Allergies  Allergen Reactions   Keflex [Cephalexin]     Family History  Problem Relation Age of Onset   Stroke Father    Cancer Mother         BREAST   Other Other        ARTHROPATHY, UNSPECIFIED SITE   Hypertension Other    Diabetes Other      Prior to Admission medications   Medication Sig Start Date End Date Taking? Authorizing Provider  acetaminophen (TYLENOL) 650 MG suppository Place 650 mg rectally every 4 (four) hours as needed for moderate pain. Temp over 100 or above   Yes [provider]  busPIRone (BUSPAR) 5 MG tablet Take 5 mg by mouth 2 (two) times daily. 12/13/21  Yes [provider]  Cholecalciferol (VITAMIN D3) 50 MCG (2000 UT) TABS Take 1 tablet by mouth daily.   Yes [provider]  ferrous sulfate 325 (65 FE) MG tablet Take 325 mg by mouth daily with breakfast.   Yes [provider]  guaiFENesin (DIABETIC TUSSIN EX) 100 MG/5ML liquid Take 10 mLs by mouth every 6 (six) hours as needed for cough or to loosen phlegm.   Yes [provider]  levothyroxine (SYNTHROID) 100 MCG tablet Take by mouth. 10/18/21  Yes [provider]  lisinopril (ZESTRIL) 10 MG tablet Take 10 mg by mouth daily. 12/17/21  Yes [provider]  mirtazapine (REMERON) 7.5 MG tablet Take 7.5 mg by mouth at bedtime. 10/18/21  Yes [provider]  Multiple Vitamins-Minerals (CENTRUM SILVER PO) Take 1 tablet by mouth daily.    Yes [provider]  senna (SENOKOT) 8.6 MG TABS tablet Take 1 tablet by mouth daily.   Yes [provider]  sertraline (ZOLOFT) 25 MG tablet Take 25 mg by mouth daily. 10/18/21  Yes [provider]  busPIRone (BUSPAR) 10 MG tablet Take 10 mg by mouth 2 (two) times daily. Patient not taking: Reported on 01/02/2022 10/18/21   [provider]  cefTRIAXone (ROCEPHIN) 1 g injection Inject 1 g into the muscle daily. Patient not taking: Reported on 01/02/2022 09/02/21   [provider]  omeprazole (PRILOSEC) 20 MG capsule Take 20 mg by mouth daily.  Patient not taking: Reported on 01/02/2022 11/28/13   [provider]     Physical Exam: BP (!) 121/104   Pulse 81   Temp 98.9 F (37.2 C) (Axillary)   Resp 17   Ht '5\' 4"'$  (1.626 m) Comment: per SNF paperwork  Wt 58.1 kg   SpO2 95%   BMI 21.97 kg/m   General: 86 y.o. year-old female lethargic, ill appearing but in no acute distress.  Alert and oriented x 1. HEENT: NCAT, PERRL Neck: Supple, trachea medial Cardiovascular: Regular rate and rhythm with no rubs or gallops.  No thyromegaly or JVD noted.  No lower extremity edema. 2/4 pulses in all 4 extremities. Respiratory: Diffuse rhonchi in lower lobes bilaterally (L > R ).  No wheezes Abdomen: Soft, nontender nondistended with normal bowel sounds x4 quadrants. Muskuloskeletal: No cyanosis, clubbing or edema noted bilaterally Neuro: Patient was moving all 4 extremities, no focal neurologic deficit.  Sensation, reflexes intact Skin: No ulcerative lesions noted or  rashes Psychiatry: This cannot be assessed at this time due to patient's current condition         Labs on Admission:  Basic Metabolic Panel: Recent Labs  Lab 01/02/22 2144  NA 142  K 4.4  CL 110  CO2 24  GLUCOSE 120*  BUN 36*  CREATININE 1.11*  CALCIUM 9.0   Liver Function Tests: Recent Labs  Lab 01/02/22 2144  AST 38  ALT 24  ALKPHOS 69  BILITOT 0.9  PROT 7.6  ALBUMIN 3.9   No results for input(s): "LIPASE", "AMYLASE" in the last 168 hours. No results for input(s): "AMMONIA" in the last 168 hours. CBC: Recent Labs  Lab 01/02/22 2144  WBC 11.1*  NEUTROABS 9.6*  HGB 12.0  HCT 37.6  MCV 92.2  PLT 174   Cardiac Enzymes: No results for input(s): "CKTOTAL", "CKMB", "CKMBINDEX", "TROPONINI" in the last 168 hours.  BNP (last 3 results) No results for input(s): "BNP" in the last 8760 hours.  ProBNP (last 3 results) No results for input(s): "PROBNP" in the last 8760 hours.  CBG: No results for input(s): "GLUCAP" in the last 168 hours.  Radiological Exams on Admission: CT Head Wo Contrast  Result Date:  01/02/2022 CLINICAL DATA:  Mental status change, unknown cause EXAM: CT HEAD WITHOUT CONTRAST TECHNIQUE: Contiguous axial images were obtained from the base of the skull through the vertex without intravenous contrast. RADIATION DOSE REDUCTION: This exam was performed according to the departmental dose-optimization program which includes automated exposure control, adjustment of the mA and/or kV according to patient size and/or use of iterative reconstruction technique. COMPARISON:  CT head 12/10/2021 BRAIN: BRAIN Cerebral ventricle sizes are concordant with the degree of cerebral volume loss. No evidence of large-territorial acute infarction. No parenchymal hemorrhage. No mass lesion. No extra-axial collection. No mass effect or midline shift. No hydrocephalus. Basilar cisterns are patent. Vascular: No hyperdense vessel. Skull: No acute fracture or focal lesion. Subacute nasal bone fracture. Sinuses/Orbits: Paranasal sinuses and mastoid air cells are clear. Bilateral lens replacement. Otherwise the orbits are unremarkable. Other: None. IMPRESSION: No acute intracranial abnormality. Electronically Signed   By: Iven Finn M.D.   On: 01/02/2022 22:56   CT Angio Chest PE W and/or Wo Contrast  Result Date: 01/02/2022 CLINICAL DATA:  Pulmonary embolism (PE) suspected, high prob. [facility]IB [facility] from [facility], called out for "choking". Upon arrival choking episode had resolved. Pt has hx of severe dementia, falls, and COPD, EMS reports lung sounds are junky, suspecting aspiration PNA. EXAM: CT ANGIOGRAPHY CHEST WITH CONTRAST TECHNIQUE: Multidetector CT imaging of the chest was performed using the standard protocol during bolus administration of intravenous contrast. Multiplanar CT image reconstructions and MIPs were obtained to evaluate the vascular anatomy. RADIATION DOSE REDUCTION: This exam was performed according to the departmental dose-optimization program which includes automated exposure control,  adjustment of the mA and/or kV according to patient size and/or use of iterative reconstruction technique. CONTRAST:  18m OMNIPAQUE IOHEXOL 350 MG/ML SOLN COMPARISON:  None Available. FINDINGS: Cardiovascular: Satisfactory opacification of the pulmonary arteries to the segmental level. No evidence of pulmonary embolism at the central and proximal segmental levels. Limited evaluation more distally due to motion artifact and lung findings. Normal heart size. No significant pericardial effusion. The thoracic aorta is normal in caliber. Moderate to severe atherosclerotic plaque of the thoracic aorta. Left anterior descending coronary artery calcifications. Mitral annular calcification. Mediastinum/Nodes: Prominent but nonenlarged right hilar and mediastinal lymph nodes. No enlarged mediastinal, hilar, or axillary lymph nodes.  Thyroid gland, trachea, and esophagus demonstrate no significant findings. Small hiatal hernia. Lungs/Pleura: Bilateral lower lobe, left greater than right peribronchovascular patchy airspace opacities. To lesser extent similar findings within the bilateral upper lobes and right middle lobe. No pulmonary nodule. No pulmonary mass. No pleural effusion. No pneumothorax. Upper Abdomen: No acute abnormality. Musculoskeletal: No chest wall abnormality. No suspicious lytic or blastic osseous lesions. No acute displaced fracture. Multilevel degenerative changes of the spine. Review of the MIP images confirms the above findings. IMPRESSION: 1. No evidence of pulmonary embolism at the central and proximal segmental levels. Limited evaluation more distally due to motion artifact and lung disease. 2. Peribronchovascular patchy airspace opacities suggestive of infection/inflammation. 3. Small hiatal hernia. 4. Aortic Atherosclerosis (ICD10-I70.0) including left anterior descending coronary artery and mitral annular calcifications. Electronically Signed   By: Iven Finn M.D.   On: 01/02/2022 22:47   DG  Chest Portable 1 View  Result Date: 01/02/2022 CLINICAL DATA:  Possible aspiration. EXAM: PORTABLE CHEST 1 VIEW COMPARISON:  None Available. FINDINGS: The heart size and mediastinal contours are within normal limits. Mild to moderate severity diffuse, chronic appearing increased interstitial lung markings are seen. Mild to moderate severity areas of atelectasis and/or infiltrate are seen within the bilateral lung bases. There is no evidence of a pleural effusion or pneumothorax. Multilevel degenerative changes are noted throughout the thoracic spine. IMPRESSION: Chronic appearing increased interstitial lung markings with mild to moderate severity bibasilar atelectasis and/or infiltrate. Electronically Signed   By: Virgina Norfolk M.D.   On: 01/02/2022 20:19    EKG: I independently viewed the EKG done and my findings are as followed: Normal sinus rhythm at a rate of 70 bpm  Assessment/Plan Present on Admission:  Aspiration pneumonia ([place])  Principal Problem:   Aspiration pneumonia ([place]) Active Problems:   Acute respiratory failure with hypoxia (HCC)   GERD (gastroesophageal reflux disease)   Essential hypertension   Anxiety   Depression   Dementia ([place])  Acute respiratory failure with hypoxia secondary to presumed aspiration pneumonia CT angiogram of chest showed  peribronchovascular patchy airspace potassium of infection/inflammation Patient was started on Unasyn and azithromycin, we shall continue same at this time with plan to de-escalate/discontinue based on blood culture, sputum culture, urine Legionella, strep pneumo and procalcitonin Continue Tylenol as needed Patient will be kept n.p.o. at this time as the precaution to prevent further aspiration Swallow eval will be done in the morning and we shall await further recommendation Continue incentive spirometry, flutter valve   Essential hypertension Continue IV hydralazine 10 mg every 6 hours as needed for SBP >  170  GERD Continue Protonix  Dementia Stable  Anxiety and depression Consider resuming home meds based on recommendation after swallow eval  DVT prophylaxis: Lovenox  Code Status: DNR  Consults: None  Family Communication: Daughter in Sports coach at bedside, sister on phone (all questions answered to satisfaction)  Severity of Illness: The appropriate patient status for this patient is INPATIENT. Inpatient status is judged to be reasonable and necessary in order to provide the required intensity of service to ensure the patient's safety. The patient's presenting symptoms, physical exam findings, and initial radiographic and laboratory data in the context of their chronic comorbidities is felt to place them at high risk for further clinical deterioration. Furthermore, it is not anticipated that the patient will be medically stable for discharge from the hospital within 2 midnights of admission.   * I certify that at the point of admission it is my clinical  judgment that the patient will require inpatient hospital care spanning beyond 2 midnights from the point of admission due to high intensity of service, high risk for further deterioration and high frequency of surveillance required.*  Author: Bernadette Hoit, DO 01/03/2022 4:18 AM  For on call review [email or web address].

## 2022-01-03 DIAGNOSIS — K219 Gastro-esophageal reflux disease without esophagitis: Secondary | ICD-10-CM

## 2022-01-03 DIAGNOSIS — J690 Pneumonitis due to inhalation of food and vomit: Secondary | ICD-10-CM | POA: Diagnosis not present

## 2022-01-03 DIAGNOSIS — F0390 Unspecified dementia without behavioral disturbance: Secondary | ICD-10-CM

## 2022-01-03 DIAGNOSIS — F419 Anxiety disorder, unspecified: Secondary | ICD-10-CM

## 2022-01-03 DIAGNOSIS — F32A Depression, unspecified: Secondary | ICD-10-CM

## 2022-01-03 DIAGNOSIS — J9601 Acute respiratory failure with hypoxia: Secondary | ICD-10-CM | POA: Diagnosis not present

## 2022-01-03 DIAGNOSIS — I10 Essential (primary) hypertension: Secondary | ICD-10-CM

## 2022-01-03 LAB — PHOSPHORUS: Phosphorus: 3.4 mg/dL (ref 2.5–4.6)

## 2022-01-03 LAB — COMPREHENSIVE METABOLIC PANEL
ALT: 23 U/L (ref 0–44)
AST: 30 U/L (ref 15–41)
Albumin: 3.4 g/dL — ABNORMAL LOW (ref 3.5–5.0)
Alkaline Phosphatase: 65 U/L (ref 38–126)
Anion gap: 10 (ref 5–15)
BUN: 32 mg/dL — ABNORMAL HIGH (ref 8–23)
CO2: 22 mmol/L (ref 22–32)
Calcium: 8.6 mg/dL — ABNORMAL LOW (ref 8.9–10.3)
Chloride: 110 mmol/L (ref 98–111)
Creatinine, Ser: 1.05 mg/dL — ABNORMAL HIGH (ref 0.44–1.00)
GFR, Estimated: 53 mL/min — ABNORMAL LOW (ref >60)
Glucose, Bld: 122 mg/dL — ABNORMAL HIGH (ref 70–99)
Potassium: 3.9 mmol/L (ref 3.5–5.1)
Sodium: 142 mmol/L (ref 135–145)
Total Bilirubin: 0.9 mg/dL (ref 0.3–1.2)
Total Protein: 6.9 g/dL (ref 6.5–8.1)

## 2022-01-03 LAB — CBC
HCT: 32.4 % — ABNORMAL LOW (ref 36.0–46.0)
Hemoglobin: 10.5 g/dL — ABNORMAL LOW (ref 12.0–15.0)
MCH: 29.5 pg (ref 26.0–34.0)
MCHC: 32.4 g/dL (ref 30.0–36.0)
MCV: 91 fL (ref 80.0–100.0)
Platelets: 164 10*3/uL (ref 150–400)
RBC: 3.56 MIL/uL — ABNORMAL LOW (ref 3.87–5.11)
RDW: 13.2 % (ref 11.5–15.5)
WBC: 13.7 10*3/uL — ABNORMAL HIGH (ref 4.0–10.5)
nRBC: 0 % (ref 0.0–0.2)

## 2022-01-03 LAB — APTT: aPTT: 32 seconds (ref 24–36)

## 2022-01-03 LAB — PROCALCITONIN: Procalcitonin: 2.16 ng/mL

## 2022-01-03 LAB — MAGNESIUM: Magnesium: 2.1 mg/dL (ref 1.7–2.4)

## 2022-01-03 MED ORDER — SODIUM CHLORIDE 0.9 % IV SOLN: INTRAVENOUS | Status: AC

## 2022-01-03 MED ORDER — HYDRALAZINE HCL 20 MG/ML IJ SOLN: 10.0000 mg | Freq: Four times a day (QID) | INTRAMUSCULAR | Status: DC | PRN

## 2022-01-03 MED ORDER — PANTOPRAZOLE SODIUM 40 MG IV SOLR
40.0000 mg | INTRAVENOUS | Status: DC
Start: 2022-01-03 — End: 2022-01-05
  Administered 2022-01-03 – 2022-01-05 (×3): 40 mg via INTRAVENOUS
  Filled 2022-01-03 (×3): qty 10

## 2022-01-03 MED ORDER — ENOXAPARIN SODIUM 40 MG/0.4ML IJ SOSY
40.0000 mg | PREFILLED_SYRINGE | INTRAMUSCULAR | Status: DC
  Administered 2022-01-03 – 2022-01-05 (×3): 40 mg via SUBCUTANEOUS
  Filled 2022-01-03 (×3): qty 0.4

## 2022-01-03 MED ORDER — ACETAMINOPHEN 325 MG PO TABS: 650.0000 mg | ORAL_TABLET | Freq: Four times a day (QID) | ORAL | Status: DC | PRN

## 2022-01-03 MED ORDER — ACETAMINOPHEN 650 MG RE SUPP: 650.0000 mg | Freq: Four times a day (QID) | RECTAL | Status: DC | PRN

## 2022-01-03 NOTE — Assessment & Plan Note (Addendum)
Continue Unasyn and azithromycin>>d/c with 4 days of amox/clav & 3 days azithro Speech therapy evaluation>>dys 1 with honey thickened liquid PCT 2.16 I discussed with patient's sister (HPOA) the goals of care>>she values quality of life and does not want such extreme limitations on patient's diet--she is willing to accept the risks of aspiration and understands risks/benefits -I will allow a liberalized diet based upon my discussion with sister -please sit up with meals, no meals in bed please -oral care before and after meals -plan to d/c with dys 3 diet with thin liquids>>sister agreeable

## 2022-01-03 NOTE — Assessment & Plan Note (Signed)
At baseline the patient is able to ambulate and feed herself She is able to order her name but usually mumbles other things

## 2022-01-03 NOTE — Evaluation (Signed)
Physical Therapy Evaluation Patient Details Name: Cassie Hanson MRN: [id number] DOB: 03-18-1936 Today's Date: 01/03/2022  History of Present Illness  Cassie Hanson is a 86 y.o. female with medical history significant of dementia, hypertension, GERD, anxiety and depression who presents to the emergency department via EMS due to aspiration.  Patient was lethargic, but was arousable and was alert and oriented only to self.  History was unable to be obtained from patient, history was obtained from ED PA and ED medical record.  Per report, patient was on a pured diet and was suspected to be having a choking episode, so EMS was activated, but prior to arrival of the EMS team, the choking episode already resolved.  She was noted to be hypoxic and was provided with supplemental oxygen via [place] at 4 LPM by EMS team, she was then taken to the ED for further evaluation and management.  Patient ambulates with a walker at baseline at the facility per daughter-in-law at bedside.   Clinical Impression  Patient requires repeated verbal/tactile cueing for follow directions, required bilateral hand held assist for ambulating in room and hallway without loss of balance, has tendency to walk too fast requiring Min assist to avoid loss of balance and limited mostly due to fatigue.  Patient sister present and assisted with encouraging patient and pushing IV pole.  Patient put back to bed with bed alarm on - nurse aware.  Patient will benefit from continued skilled physical therapy in hospital and recommended venue below to increase strength, balance, endurance for safe ADLs and gait.        Recommendations for follow up therapy are one component of a multi-disciplinary discharge planning process, led by the attending physician.  Recommendations may be updated based on patient status, additional functional criteria and insurance authorization.  Follow Up Recommendations Home health PT      Assistance Recommended at  Discharge Frequent or constant Supervision/Assistance  Patient can return home with the following  A little help with walking and/or transfers;A little help with bathing/dressing/bathroom;Help with stairs or ramp for entrance;Assistance with cooking/housework    Equipment Recommendations None recommended by PT  Recommendations for Other Services       Functional Status Assessment Patient has had a recent decline in their functional status and demonstrates the ability to make significant improvements in function in a reasonable and predictable amount of time.     Precautions / Restrictions Precautions Precautions: Fall Restrictions Weight Bearing Restrictions: No      Mobility  Bed Mobility Overal bed mobility: Needs Assistance Bed Mobility: Supine to Sit, Sit to Supine     Supine to sit: Mod assist Sit to supine: Min assist, Mod assist   General bed mobility comments: requires Mod assist mostly due to difficulty following directions    Transfers Overall transfer level: Needs assistance Equipment used: 1 person hand held assist Transfers: Sit to/from Stand, Bed to chair/wheelchair/BSC Sit to Stand: Min guard, Min assist   Step pivot transfers: Min assist       General transfer comment: requires repeated verbal/tactile cueing, slight unsteady on feet, poor carryover for attempting to use AD    Ambulation/Gait Ambulation/Gait assistance: Min assist Gait Distance (Feet): 75 Feet Assistive device: 1 person hand held assist Gait Pattern/deviations: Decreased step length - right, Decreased step length - left, Decreased stride length Gait velocity: near normal     General Gait Details: required hand held assist holding both of patient's hands with slightly labored cadence with near normal  speed, requires verbal/tactile cueing not to go to fast  Stairs            Wheelchair Mobility    Modified Rankin (Stroke Patients Only)       Balance Overall balance  assessment: Needs assistance Sitting-balance support: Feet supported, No upper extremity supported Sitting balance-Leahy Scale: Fair Sitting balance - Comments: fair/good seated at EOB   Standing balance support: During functional activity, Bilateral upper extremity supported Standing balance-Leahy Scale: Poor Standing balance comment: fair/poor with bilateral hand held assist                             Pertinent Vitals/Pain Pain Assessment Pain Assessment: No/denies pain    Home Living Family/patient expects to be discharged to:: Assisted living                 Home Equipment: Rolling Walker (2 wheels) Additional Comments: walks wiht walker per chart, walks without per patient's sister    Prior Function Prior Level of Function : Needs assist       Physical Assist : Mobility (physical);ADLs (physical) Mobility (physical): Bed mobility;Transfers;Gait;Stairs   Mobility Comments: household ambulator without AD per patient's sister, ambulate using RW per chart ADLs Comments: assisted by ALF staff     Hand Dominance        Extremity/Trunk Assessment   Upper Extremity Assessment Upper Extremity Assessment: Overall WFL for tasks assessed    Lower Extremity Assessment Lower Extremity Assessment: Generalized weakness    Cervical / Trunk Assessment Cervical / Trunk Assessment: Normal  Communication   Communication: Expressive difficulties  Cognition Arousal/Alertness: Awake/alert Behavior During Therapy: Restless Overall Cognitive Status: History of cognitive impairments - at baseline                                 General Comments: requires repeated verbal/tactile cueing to follow directions        General Comments      Exercises     Assessment/Plan    PT Assessment Patient needs continued PT services  PT Problem List Decreased strength;Decreased activity tolerance;Decreased balance;Decreased mobility       PT Treatment  Interventions DME instruction;Gait training;Stair training;Functional mobility training;Therapeutic activities;Therapeutic exercise;Patient/family education;Balance training    PT Goals (Current goals can be found in the Care Plan section)  Acute Rehab PT Goals Patient Stated Goal: not stated PT Goal Formulation: With patient/family Time For Goal Achievement: 01/10/22 Potential to Achieve Goals: Good    Frequency Min 3X/week     Co-evaluation               AM-PAC PT "6 Clicks" Mobility  Outcome Measure Help needed turning from your back to your side while in a flat bed without using bedrails?: A Little Help needed moving from lying on your back to sitting on the side of a flat bed without using bedrails?: A Lot Help needed moving to and from a bed to a chair (including a wheelchair)?: A Little Help needed standing up from a chair using your arms (e.g., wheelchair or bedside chair)?: A Little Help needed to walk in hospital room?: A Little Help needed climbing 3-5 steps with a railing? : A Lot 6 Click Score: 16    End of Session   Activity Tolerance: Patient tolerated treatment well;Patient limited by fatigue Patient left: in chair;with call bell/phone within reach;with family/visitor present;with chair alarm  set Nurse Communication: Mobility status PT Visit Diagnosis: Unsteadiness on feet (R26.81);Other abnormalities of gait and mobility (R26.89);Muscle weakness (generalized) (M62.81)    Time: 9379-0240 PT Time Calculation (min) (ACUTE ONLY): 31 min   Charges:   PT Evaluation $PT Eval Moderate Complexity: 1 Mod PT Treatments $Therapeutic Activity: 23-37 mins        3:20 PM, 01/03/22 Lonell Grandchild, MPT Physical Therapist with [facility] [phone] office [phone] mobile phone

## 2022-01-03 NOTE — Assessment & Plan Note (Signed)
Holding Zoloft temporarily>>restart

## 2022-01-03 NOTE — Progress Notes (Signed)
Patient unable to follow directions for use of IS or  Flutter valve. She was able to breath in deeply through her nose but unalb to purse ;ip breath.

## 2022-01-03 NOTE — Progress Notes (Signed)
PROGRESS NOTE  Cassie Hanson OIN:[id number] DOB: [birth year] DOA: 01/02/2022 PCP: Sinda Du, MD  Brief History:  86 year old female with a history of dementia, hypertension, COPD, GERD presenting from [facility] with an aspiration event.  At baseline, the patient is confused and only speaks a few words and frequently mumbles.  Apparently, the patient has been on pured diet and had 2 choking episodes prior to admission.  She developed hypoxia with oxygen saturation in 70s.  And shortness of breath.  She was placed by EMS on 4 L nasal cannula and taken to the emergency department for further evaluation.  The patient herself is unable to provide any history.  In speaking with the patient's sister, the patient is normally able to ambulate without assistance and she is able to feed herself.  At baseline, the patient is on a pured diet. In the emergency department, the patient was afebrile hemodynamically stable.  Oxygen saturation was 95% on 4 L.  WBC 11.1, hemoglobin 12.0, platelets 174.  Sodium 142, potassium 4.4, bicarbonate 24, serum creatinine 1.11.  ABG showed pH 7.36/50/<31/27.  Patient was started on Unasyn and azithromycin.     Assessment and Plan: * Acute respiratory failure with hypoxia (HCC) Presented with shortness of breath and hypoxia in the 70s Secondary to aspiration pneumonia Continue Unasyn and azithromycin Therapy evaluation Wean oxygen to room air  Aspiration pneumonia ([place]) Continue Unasyn and azithromycin Speech therapy evaluation PCT 2.16  Dementia ([place]) At baseline the patient is able to ambulate and feed herself She is able to order her name but usually mumbles other things  Depression Holding BuSpar and Zoloft temporarily Holding Remeron  Anxiety Holding BuSpar and Zoloft temporarily  Essential hypertension Holding lisinopril         Family Communication:   sister updated 9/8  Consultants:  none  Code Status:  DNR  DVT  Prophylaxis:  [place] Lovenox   Procedures: As Listed in Progress Note Above  Antibiotics: None       Subjective: Review of systems unobtainable secondary to patient's mental status  Objective: Vitals:   01/02/22 2252 01/02/22 2330 01/03/22 0027 01/03/22 0600  BP: (!) 141/58 132/70 (!) 121/104 128/62  Pulse: 78 82 81 66  Resp: '20 16 17 16  '$ Temp:  98.9 F (37.2 C) 98.9 F (37.2 C) 98.2 F (36.8 C)  TempSrc:  Axillary Axillary Axillary  SpO2: 98% 95% 95% 95%  Weight:      Height:        Intake/Output Summary (Last 24 hours) at 01/03/2022 1342 Last data filed at 01/03/2022 1100 Gross per 24 hour  Intake 0 ml  Output 250 ml  Net -250 ml   Weight change:  Exam:  General:  Pt is alert, follows commands appropriately, not in acute distress HEENT: No icterus, No thrush, No neck mass, [place]/AT Cardiovascular: RRR, S1/S2, no rubs, no gallops Respiratory: Bibasilar rales.  No wheezing.  Good air movement Abdomen: Soft/+BS, non tender, non distended, no guarding Extremities: No edema, No lymphangitis, No petechiae, No rashes, no synovitis   Data Reviewed: I have personally reviewed following labs and imaging studies Basic Metabolic Panel: Recent Labs  Lab 01/02/22 2144 01/03/22 0432  NA 142 142  K 4.4 3.9  CL 110 110  CO2 24 22  GLUCOSE 120* 122*  BUN 36* 32*  CREATININE 1.11* 1.05*  CALCIUM 9.0 8.6*  MG  --  2.1  PHOS  --  3.4  Liver Function Tests: Recent Labs  Lab 01/02/22 2144 01/03/22 0432  AST 38 30  ALT 24 23  ALKPHOS 69 65  BILITOT 0.9 0.9  PROT 7.6 6.9  ALBUMIN 3.9 3.4*   No results for input(s): "LIPASE", "AMYLASE" in the last 168 hours. No results for input(s): "AMMONIA" in the last 168 hours. Coagulation Profile: No results for input(s): "INR", "PROTIME" in the last 168 hours. CBC: Recent Labs  Lab 01/02/22 2144 01/03/22 0432  WBC 11.1* 13.7*  NEUTROABS 9.6*  --   HGB 12.0 10.5*  HCT 37.6 32.4*  MCV 92.2 91.0  PLT 174 164   Cardiac  Enzymes: No results for input(s): "CKTOTAL", "CKMB", "CKMBINDEX", "TROPONINI" in the last 168 hours. BNP: Invalid input(s): "POCBNP" CBG: No results for input(s): "GLUCAP" in the last 168 hours. HbA1C: No results for input(s): "HGBA1C" in the last 72 hours. Urine analysis: No results found for: "COLORURINE", "APPEARANCEUR", "LABSPEC", "PHURINE", "GLUCOSEU", "HGBUR", "BILIRUBINUR", "KETONESUR", "PROTEINUR", "UROBILINOGEN", "NITRITE", "LEUKOCYTESUR" Sepsis Labs: '@LABRCNTIP'$ (procalcitonin:4,lacticidven:4) ) Recent Results (from the past 240 hour(s))  Resp Panel by RT-PCR (Flu A&B, Covid) Anterior Nasal Swab     Status: None   Collection Time: 01/02/22  8:15 PM   Specimen: Anterior Nasal Swab  Result Value Ref Range Status   SARS Coronavirus 2 by RT PCR NEGATIVE NEGATIVE Final    Comment: (NOTE) SARS-CoV-2 target nucleic acids are NOT DETECTED.  The SARS-CoV-2 RNA is generally detectable in upper respiratory specimens during the acute phase of infection. The lowest concentration of SARS-CoV-2 viral copies this assay can detect is 138 copies/mL. A negative result does not preclude SARS-Cov-2 infection and should not be used as the sole basis for treatment or other patient management decisions. A negative result may occur with  improper specimen collection/handling, submission of specimen other than nasopharyngeal swab, presence of viral mutation(s) within the areas targeted by this assay, and inadequate number of viral copies(<138 copies/mL). A negative result must be combined with clinical observations, patient history, and epidemiological information. The expected result is Negative.  Fact Sheet for Patients:  EntrepreneurPulse.com.au  Fact Sheet for Healthcare Providers:  IncredibleEmployment.be  This test is no t yet approved or cleared by the [place] FDA and  has been authorized for detection and/or diagnosis of SARS-CoV-2 by FDA  under an Emergency Use Authorization (EUA). This EUA will remain  in effect (meaning this test can be used) for the duration of the COVID-19 declaration under Section 564(b)(1) of the Act, 21 U.S.C.section 360bbb-3(b)(1), unless the authorization is terminated  or revoked sooner.       Influenza A by PCR NEGATIVE NEGATIVE Final   Influenza B by PCR NEGATIVE NEGATIVE Final    Comment: (NOTE) The Xpert Xpress SARS-CoV-2/FLU/RSV plus assay is intended as an aid in the diagnosis of influenza from Nasopharyngeal swab specimens and should not be used as a sole basis for treatment. Nasal washings and aspirates are unacceptable for Xpert Xpress SARS-CoV-2/FLU/RSV testing.  Fact Sheet for Patients: EntrepreneurPulse.com.au  Fact Sheet for Healthcare Providers: IncredibleEmployment.be  This test is not yet approved or cleared by the [place] FDA and has been authorized for detection and/or diagnosis of SARS-CoV-2 by FDA under an Emergency Use Authorization (EUA). This EUA will remain in effect (meaning this test can be used) for the duration of the COVID-19 declaration under Section 564(b)(1) of the Act, 21 U.S.C. section 360bbb-3(b)(1), unless the authorization is terminated or revoked.  Performed at [facility], [address] [place], [place] [address]  Culture, blood (Routine X 2) w Reflex to ID Panel     Status: None (Preliminary result)   Collection Time: 01/03/22  4:32 AM   Specimen: BLOOD LEFT HAND  Result Value Ref Range Status   Specimen Description   Final    BLOOD LEFT HAND BOTTLES DRAWN AEROBIC AND ANAEROBIC   Special Requests   Final    Blood Culture adequate volume Performed at [facility], [address], [place], [place] [address]    Culture PENDING  Incomplete   Report Status PENDING  Incomplete     Scheduled Meds:  enoxaparin (LOVENOX) injection  40 mg Subcutaneous Q24H   pantoprazole (PROTONIX) IV  40 mg Intravenous  Q24H   Continuous Infusions:  ampicillin-sulbactam (UNASYN) IV 3 g (01/03/22 0909)   azithromycin 500 mg (01/03/22 0028)    Procedures/Studies: CT Head Wo Contrast  Result Date: 01/02/2022 CLINICAL DATA:  Mental status change, unknown cause EXAM: CT HEAD WITHOUT CONTRAST TECHNIQUE: Contiguous axial images were obtained from the base of the skull through the vertex without intravenous contrast. RADIATION DOSE REDUCTION: This exam was performed according to the departmental dose-optimization program which includes automated exposure control, adjustment of the mA and/or kV according to patient size and/or use of iterative reconstruction technique. COMPARISON:  CT head 12/10/2021 BRAIN: BRAIN Cerebral ventricle sizes are concordant with the degree of cerebral volume loss. No evidence of large-territorial acute infarction. No parenchymal hemorrhage. No mass lesion. No extra-axial collection. No mass effect or midline shift. No hydrocephalus. Basilar cisterns are patent. Vascular: No hyperdense vessel. Skull: No acute fracture or focal lesion. Subacute nasal bone fracture. Sinuses/Orbits: Paranasal sinuses and mastoid air cells are clear. Bilateral lens replacement. Otherwise the orbits are unremarkable. Other: None. IMPRESSION: No acute intracranial abnormality. Electronically Signed   By: Iven Finn M.D.   On: 01/02/2022 22:56   CT Angio Chest PE W and/or Wo Contrast  Result Date: 01/02/2022 CLINICAL DATA:  Pulmonary embolism (PE) suspected, high prob. Pt BIB [facility] from nursing home, called out for "choking". Upon arrival choking episode had resolved. Pt has hx of severe dementia, falls, and COPD, EMS reports lung sounds are junky, suspecting aspiration PNA. EXAM: CT ANGIOGRAPHY CHEST WITH CONTRAST TECHNIQUE: Multidetector CT imaging of the chest was performed using the standard protocol during bolus administration of intravenous contrast. Multiplanar CT image reconstructions and MIPs were obtained to  evaluate the vascular anatomy. RADIATION DOSE REDUCTION: This exam was performed according to the departmental dose-optimization program which includes automated exposure control, adjustment of the mA and/or kV according to patient size and/or use of iterative reconstruction technique. CONTRAST:  83m OMNIPAQUE IOHEXOL 350 MG/ML SOLN COMPARISON:  None Available. FINDINGS: Cardiovascular: Satisfactory opacification of the pulmonary arteries to the segmental level. No evidence of pulmonary embolism at the central and proximal segmental levels. Limited evaluation more distally due to motion artifact and lung findings. Normal heart size. No significant pericardial effusion. The thoracic aorta is normal in caliber. Moderate to severe atherosclerotic plaque of the thoracic aorta. Left anterior descending coronary artery calcifications. Mitral annular calcification. Mediastinum/Nodes: Prominent but nonenlarged right hilar and mediastinal lymph nodes. No enlarged mediastinal, hilar, or axillary lymph nodes. Thyroid gland, trachea, and esophagus demonstrate no significant findings. Small hiatal hernia. Lungs/Pleura: Bilateral lower lobe, left greater than right peribronchovascular patchy airspace opacities. To lesser extent similar findings within the bilateral upper lobes and right middle lobe. No pulmonary nodule. No pulmonary mass. No pleural effusion. No pneumothorax. Upper Abdomen: No acute abnormality. Musculoskeletal: No chest  wall abnormality. No suspicious lytic or blastic osseous lesions. No acute displaced fracture. Multilevel degenerative changes of the spine. Review of the MIP images confirms the above findings. IMPRESSION: 1. No evidence of pulmonary embolism at the central and proximal segmental levels. Limited evaluation more distally due to motion artifact and lung disease. 2. Peribronchovascular patchy airspace opacities suggestive of infection/inflammation. 3. Small hiatal hernia. 4. Aortic Atherosclerosis  (ICD10-I70.0) including left anterior descending coronary artery and mitral annular calcifications. Electronically Signed   By: Iven Finn M.D.   On: 01/02/2022 22:47   DG Chest Portable 1 View  Result Date: 01/02/2022 CLINICAL DATA:  Possible aspiration. EXAM: PORTABLE CHEST 1 VIEW COMPARISON:  None Available. FINDINGS: The heart size and mediastinal contours are within normal limits. Mild to moderate severity diffuse, chronic appearing increased interstitial lung markings are seen. Mild to moderate severity areas of atelectasis and/or infiltrate are seen within the bilateral lung bases. There is no evidence of a pleural effusion or pneumothorax. Multilevel degenerative changes are noted throughout the thoracic spine. IMPRESSION: Chronic appearing increased interstitial lung markings with mild to moderate severity bibasilar atelectasis and/or infiltrate. Electronically Signed   By: Virgina Norfolk M.D.   On: 01/02/2022 20:19   DG Wrist Complete Left  Result Date: 12/10/2021 CLINICAL DATA:  Left wrist pain and bruising following fall, initial encounter EXAM: LEFT WRIST - COMPLETE 3+ VIEW COMPARISON:  None Available. FINDINGS: Widening of the scapholunate space is noted consistent with ligamentous injury likely of a chronic nature. Degenerative changes of the first [facility] joint are seen. No acute fracture or dislocation is seen. No soft tissue abnormality is noted. IMPRESSION: Degenerative changes without acute abnormality. Widening of the scapholunate space is seen as described. Electronically Signed   By: Inez Catalina M.D.   On: 12/10/2021 22:40   DG Lumbar Spine Complete  Result Date: 12/10/2021 CLINICAL DATA:  History of recent fall with back pain, initial encounter EXAM: LUMBAR SPINE - COMPLETE 4+ VIEW COMPARISON:  None Available. FINDINGS: Five lumbar type vertebral bodies are well visualized. Multilevel osteophytic changes and facet hypertrophic changes are seen. No compression deformity is  noted. No anterolisthesis is seen. Multilevel disc space narrowing is noted. Surrounding soft tissue structures are within normal limits. IMPRESSION: Multilevel degenerative change without acute abnormality. Electronically Signed   By: Inez Catalina M.D.   On: 12/10/2021 22:39   CT Head Wo Contrast  Result Date: 12/10/2021 CLINICAL DATA:  Neck trauma (Age >= 65y); Head trauma, minor (Age >= 65y). Nursing home reported to EMS that pt fell and hit the right side of her head. A bluish hematoma is visible. EXAM: CT HEAD WITHOUT CONTRAST CT CERVICAL SPINE WITHOUT CONTRAST TECHNIQUE: Multidetector CT imaging of the head and cervical spine was performed following the standard protocol without intravenous contrast. Multiplanar CT image reconstructions of the cervical spine were also generated. RADIATION DOSE REDUCTION: This exam was performed according to the departmental dose-optimization program which includes automated exposure control, adjustment of the mA and/or kV according to patient size and/or use of iterative reconstruction technique. COMPARISON:  CT head and cervical spine 04/01/2019 FINDINGS: CT HEAD FINDINGS BRAIN: BRAIN Cerebral ventricle sizes are concordant with the degree of cerebral volume loss. Patchy and confluent areas of decreased attenuation are noted throughout the deep and periventricular white matter of the cerebral hemispheres bilaterally, compatible with chronic microvascular ischemic disease. No evidence of large-territorial acute infarction. No parenchymal hemorrhage. No mass lesion. No extra-axial collection. No mass effect or midline shift. No hydrocephalus.  Basilar cisterns are patent. Vascular: No hyperdense vessel. Atherosclerotic calcifications are present within the cavernous internal carotid arteries. Skull: Acute comminuted right nasal bone fracture. Otherwise no acute fracture or focal lesion. Sinuses/Orbits: Paranasal sinuses and mastoid air cells are clear. Bilateral lens  replacement. Otherwise the orbits are unremarkable. Other: Right periorbital subcutaneus soft tissue hematoma. Marked scalp 7 mm subcutaneus soft tissue hematoma. CT CERVICAL SPINE FINDINGS Alignment: Grade 1 anterolisthesis of C3 on C4. Skull base and vertebrae: Multilevel degenerative changes of the spine. No associated severe osseous neural foraminal or central canal stenosis. No acute fracture. No aggressive appearing focal osseous lesion or focal pathologic process. Soft tissues and spinal canal: No prevertebral fluid or swelling. No visible canal hematoma. Upper chest: Unremarkable. Other: Atherosclerotic plaque of the carotid arteries within neck. IMPRESSION: 1. No acute intracranial abnormality. 2. No acute displaced fracture or traumatic listhesis of the cervical spine. 3. Acute comminuted right nasal bone fracture. Electronically Signed   By: Iven Finn M.D.   On: 12/10/2021 22:23   CT Cervical Spine Wo Contrast  Result Date: 12/10/2021 CLINICAL DATA:  Neck trauma (Age >= 65y); Head trauma, minor (Age >= 65y). Nursing home reported to EMS that pt fell and hit the right side of her head. A bluish hematoma is visible. EXAM: CT HEAD WITHOUT CONTRAST CT CERVICAL SPINE WITHOUT CONTRAST TECHNIQUE: Multidetector CT imaging of the head and cervical spine was performed following the standard protocol without intravenous contrast. Multiplanar CT image reconstructions of the cervical spine were also generated. RADIATION DOSE REDUCTION: This exam was performed according to the departmental dose-optimization program which includes automated exposure control, adjustment of the mA and/or kV according to patient size and/or use of iterative reconstruction technique. COMPARISON:  CT head and cervical spine 04/01/2019 FINDINGS: CT HEAD FINDINGS BRAIN: BRAIN Cerebral ventricle sizes are concordant with the degree of cerebral volume loss. Patchy and confluent areas of decreased attenuation are noted throughout the  deep and periventricular white matter of the cerebral hemispheres bilaterally, compatible with chronic microvascular ischemic disease. No evidence of large-territorial acute infarction. No parenchymal hemorrhage. No mass lesion. No extra-axial collection. No mass effect or midline shift. No hydrocephalus. Basilar cisterns are patent. Vascular: No hyperdense vessel. Atherosclerotic calcifications are present within the cavernous internal carotid arteries. Skull: Acute comminuted right nasal bone fracture. Otherwise no acute fracture or focal lesion. Sinuses/Orbits: Paranasal sinuses and mastoid air cells are clear. Bilateral lens replacement. Otherwise the orbits are unremarkable. Other: Right periorbital subcutaneus soft tissue hematoma. Marked scalp 7 mm subcutaneus soft tissue hematoma. CT CERVICAL SPINE FINDINGS Alignment: Grade 1 anterolisthesis of C3 on C4. Skull base and vertebrae: Multilevel degenerative changes of the spine. No associated severe osseous neural foraminal or central canal stenosis. No acute fracture. No aggressive appearing focal osseous lesion or focal pathologic process. Soft tissues and spinal canal: No prevertebral fluid or swelling. No visible canal hematoma. Upper chest: Unremarkable. Other: Atherosclerotic plaque of the carotid arteries within neck. IMPRESSION: 1. No acute intracranial abnormality. 2. No acute displaced fracture or traumatic listhesis of the cervical spine. 3. Acute comminuted right nasal bone fracture. Electronically Signed   By: Iven Finn M.D.   On: 12/10/2021 22:23    Orson Eva, DO  [facility]  If 7PM-7AM, please contact night-coverage [email or web address] Password [email or web address] 01/03/2022, 1:42 PM   LOS: 1 day

## 2022-01-03 NOTE — Evaluation (Signed)
Clinical/Bedside Swallow Evaluation Patient Details  Name: Cassie Hanson MRN: [id number] Date of Birth: 23-Sep-1935  Today's Date: 01/03/2022 Time: SLP Start Time (ACUTE ONLY): 41 SLP Stop Time (ACUTE ONLY): 1443 SLP Time Calculation (min) (ACUTE ONLY): 24 min  Past Medical History:  Past Medical History:  Diagnosis Date   Anxiety    Asthma    Bursitis disorder    CKD (chronic kidney disease)    COPD (chronic obstructive pulmonary disease) (HCC)    Dementia with behavioral disturbance (HCC)    Depressive disorder, not elsewhere classified    GERD (gastroesophageal reflux disease)    Hypertension    Lumbago    MDD (major depressive disorder)    Morbid obesity ([place])    Osteoarthrosis, unspecified whether generalized or localized, unspecified site    Other and unspecified hyperlipidemia    Pain in joint    INVOLVING LOWER LEG   PVD (peripheral vascular disease) (HCC)    Reflux esophagitis    Schizoaffective disorder, depressive type ([place])    Unspecified hypothyroidism    Unspecified mood (affective) disorder ([place])    Unspecified vitamin D deficiency    Past Surgical History:  Past Surgical History:  Procedure Laterality Date   ABDOMINAL HYSTERECTOMY     BACK SURGERY     CHOLECYSTECTOMY     COLONOSCOPY N/A 03/16/2014   Procedure: COLONOSCOPY;  Surgeon: Rogene Houston, MD;  Location: [facility];  Service: Endoscopy;  Laterality: N/A;  1200 - moved to 7:30 - Ann to notify pt   UNLISTED PROCEDURE     FOREARM/WRIST   HPI:  Cassie Hanson is a 86 y.o. female with medical history significant of dementia, hypertension, GERD, anxiety and depression who presents to the emergency department via EMS due to aspiration on 01/02/22.   Patient was lethargic, but was arousable and was alert and oriented only to self.  History was unable to be obtained from patient, history was obtained from ED PA and ED medical record.  Per report, patient was on a pured diet and was suspected to be having  a choking episode, so EMS was activated, but prior to arrival of the EMS team, the choking episode already resolved.  She was noted to be hypoxic and was provided with supplemental oxygen via  at 4 LPM by EMS team, she was then taken to the ED for further evaluation and management. CXR on 01/02/22 indicated Chronic appearing increased interstitial lung markings with mild to moderate severity bibasilar atelectasis and/or infiltrate.  ST consulted for BSE to assess swallowing function.    Assessment / Plan / Recommendation  Clinical Impression  Pt seen for clinical swallowing evaluation with oral care completed prior to PO intake with subsequent bolus in L buccal area removed with a swab (and what appeared to be a bottle cap from a water bottle) prior to PO intake.  Pt's oral cavity assessment revealed xerostomia and pt with tongue thrust posture at baseline which contributed to lingual dryness; but with oral care provided, oral mucosa color/moisture improved.  Pt initiated a swallow post-oral care independently, but was unable to follow oral directives during OME despite max visual/verbal cueing provided.  Vocal quality noted to be hoarse/breathy during communicative attempts which were primarily perseverative/nonsensical.  Pt exhibits oropharyngeal dysphagia characterized by oral holding, decreased bolus manipulation and delay in the initiation of the swallow.  Delayed cough noted with nectar-thickened liquids and immediate/delayed cough observed with thin liquids/ice chips depending on bolus size.  Honey-thickened liquids via  spoon and puree consistency eliminated immediate/delayed cough, although oral holding/decreased bolus manipulation continued to be present.  Recommend initiating a Dysphagia 1(pureed)/honey-thickened liquid (via 1/2 tsp amounts) diet with swallowing/aspiration precautions in place and posted within pt room.  FULL supervision is recommended during all PO intake.  ST will f/u for diet  progression and dysphagia tx while in acute setting.  Thank you for this consult. SLP Visit Diagnosis: Dysphagia, oropharyngeal phase (R13.12)    Aspiration Risk  Moderate aspiration risk    Diet Recommendation   Dysphagia 1(pureed)/honey-thickened liquids via tsp only  Medication Administration: Crushed with puree    Other  Recommendations Oral Care Recommendations: Oral care BID;Staff/trained caregiver to provide oral care    Recommendations for follow up therapy are one component of a multi-disciplinary discharge planning process, led by the attending physician.  Recommendations may be updated based on patient status, additional functional criteria and insurance authorization.  Follow up Recommendations Skilled nursing-short term rehab (<3 hours/day)      Assistance Recommended at Discharge Frequent or constant Supervision/Assistance  Functional Status Assessment Patient has had a recent decline in their functional status and/or demonstrates limited ability to make significant improvements in function in a reasonable and predictable amount of time  Frequency and Duration min 2x/week  1 week       Prognosis Prognosis for Safe Diet Advancement: Fair Barriers to Reach Goals: Cognitive deficits;Severity of deficits      Swallow Study   General Date of Onset: 40/97/35 HPI: Cassie Hanson is a 86 y.o. female with medical history significant of dementia, hypertension, GERD, anxiety and depression who presents to the emergency department via EMS due to aspiration on 01/02/22.   Patient was lethargic, but was arousable and was alert and oriented only to self.  History was unable to be obtained from patient, history was obtained from ED PA and ED medical record.  Per report, patient was on a pured diet and was suspected to be having a choking episode, so EMS was activated, but prior to arrival of the EMS team, the choking episode already resolved.  She was noted to be hypoxic and was provided  with supplemental oxygen via [place] at 4 LPM by EMS team, she was then taken to the ED for further evaluation and management. ST consulted for BSE to assess swallowing function. Type of Study: Bedside Swallow Evaluation Previous Swallow Assessment: n/a Diet Prior to this Study: NPO Temperature Spikes Noted: No Respiratory Status: Room air History of Recent Intubation: No Behavior/Cognition: Alert;Confused;Impulsive;Distractible;Doesn't follow directions Oral Cavity Assessment: Dry Oral Care Completed by SLP: Yes Oral Cavity - Dentition: Missing dentition;Edentulous Self-Feeding Abilities: Total assist Patient Positioning: Upright in bed Baseline Vocal Quality: Hoarse;Breathy Volitional Cough: Cognitively unable to elicit Volitional Swallow: Unable to elicit    Oral/Motor/Sensory Function Overall Oral Motor/Sensory Function: Generalized oral weakness (DTA d/t cognitive impairment)   Ice Chips Ice chips: Impaired Presentation: Spoon Oral Phase Functional Implications: Prolonged oral transit;Oral holding Pharyngeal Phase Impairments: Cough - Immediate;Suspected delayed Swallow   Thin Liquid Thin Liquid: Impaired Presentation: Spoon Oral Phase Functional Implications: Oral holding Pharyngeal  Phase Impairments: Suspected delayed Swallow;Cough - Immediate    Nectar Thick Nectar Thick Liquid: Impaired Presentation: Spoon;Straw Oral Phase Impairments: Reduced lingual movement/coordination Oral phase functional implications: Oral holding Pharyngeal Phase Impairments: Suspected delayed Swallow;Cough - Delayed;Cough - Immediate   Honey Thick Honey Thick Liquid: Impaired Presentation: Spoon Oral Phase Functional Implications: Oral holding Pharyngeal Phase Impairments: Suspected delayed Swallow   Puree Puree: Impaired Presentation:  Spoon Oral Phase Impairments: Reduced lingual movement/coordination Oral Phase Functional Implications: Oral holding;Prolonged oral transit Pharyngeal Phase  Impairments: Suspected delayed Swallow   Solid     Solid: Not tested      Elvina Sidle, M.S., CCC-SLP 01/03/2022,3:58 PM

## 2022-01-03 NOTE — Progress Notes (Signed)
  Transition of Care [facility]) Screening Note   Patient Details  Name: Cassie Hanson Date of Birth: [birth year]   Transition of Care [facility]) CM/SW Contact:    Ihor Gully, LCSW Phone Number: 01/03/2022, 2:38 PM    Transition of Care Department [facility]) has reviewed patient and no TOC needs have been identified at this time. We will continue to monitor patient advancement through interdisciplinary progression rounds. If new patient transition needs arise, please place a TOC consult.

## 2022-01-03 NOTE — Hospital Course (Signed)
86 year old female with a history of dementia, hypertension, COPD, GERD presenting from [facility] with an aspiration event.  At baseline, the patient is confused and only speaks a few words and frequently mumbles.  Apparently, the patient has been on pured diet and had 2 choking episodes prior to admission.  She developed hypoxia with oxygen saturation in 70s.  And shortness of breath.  She was placed by EMS on 4 L nasal cannula and taken to the emergency department for further evaluation.  The patient herself is unable to provide any history.  In speaking with the patient's sister, the patient is normally able to ambulate without assistance and she is able to feed herself.  At baseline, the patient is on a pured diet. In the emergency department, the patient was afebrile hemodynamically stable.  Oxygen saturation was 95% on 4 L.  WBC 11.1, hemoglobin 12.0, platelets 174.  Sodium 142, potassium 4.4, bicarbonate 24, serum creatinine 1.11.  ABG showed pH 7.36/50/<31/27.  Patient was started on Unasyn and azithromycin.

## 2022-01-03 NOTE — Plan of Care (Signed)
  Problem: Acute Rehab PT Goals(only PT should resolve) Goal: Pt Will Go Supine/Side To Sit Outcome: Progressing Flowsheets (Taken 01/03/2022 1522) Pt will go Supine/Side to Sit:  with supervision  with min guard assist Goal: Patient Will Transfer Sit To/From Stand Outcome: Progressing Flowsheets (Taken 01/03/2022 1522) Patient will transfer sit to/from stand:  with supervision  with min guard assist Goal: Pt Will Transfer Bed To Chair/Chair To Bed Outcome: Progressing Flowsheets (Taken 01/03/2022 1522) Pt will Transfer Bed to Chair/Chair to Bed:  with supervision  min guard assist Goal: Pt Will Ambulate Outcome: Progressing Flowsheets (Taken 01/03/2022 1522) Pt will Ambulate:  100 feet  with min guard assist  with rolling walker  with least restrictive assistive device Note: RW if possible   3:23 PM, 01/03/22 Lonell Grandchild, MPT Physical Therapist with [facility]: [place] [address] [phone] office [phone] mobile phone

## 2022-01-03 NOTE — Assessment & Plan Note (Signed)
Holding lisinopril BP remains well controlled

## 2022-01-03 NOTE — Assessment & Plan Note (Signed)
Presented with shortness of breath and hypoxia in the 70s Secondary to aspiration pneumonia Continue Unasyn and azithromycin>>d/c with 4 days of amox/clav & 3 days Now stable on RA I discussed with patient's sister (HPOA) the goals of care>>she values quality of life and does not want such extreme limitations on patient's diet--she is willing to accept the risks of aspiration and understands risks/benefits -I will allow a liberalized diet based upon my discussion with sister

## 2022-01-03 NOTE — Assessment & Plan Note (Addendum)
Holding Zoloft temporarily>>restart Holding Remeron>>restart

## 2022-01-03 NOTE — Progress Notes (Signed)
When Speech therapy in to access patient, she discovered a bottle cap in patients cheek pocketed appeared to have food inside of it and unknown substance from oral cavity . Patient is NPO . She wouldn't open her mouth earlier for this writer to look inside of her mouth , I did offer oral care and patient was asleep she pushed my hands away, so I wiped the outside of her mouth with warm washrag. Patient has been sleepy most of shift she is awake now and accepting liquids provided by ST. Will notify provider of findings

## 2022-01-04 DIAGNOSIS — J9601 Acute respiratory failure with hypoxia: Secondary | ICD-10-CM | POA: Diagnosis not present

## 2022-01-04 DIAGNOSIS — I10 Essential (primary) hypertension: Secondary | ICD-10-CM | POA: Diagnosis not present

## 2022-01-04 DIAGNOSIS — J690 Pneumonitis due to inhalation of food and vomit: Secondary | ICD-10-CM | POA: Diagnosis not present

## 2022-01-04 LAB — BASIC METABOLIC PANEL
Anion gap: 5 (ref 5–15)
BUN: 29 mg/dL — ABNORMAL HIGH (ref 8–23)
CO2: 24 mmol/L (ref 22–32)
Calcium: 8.7 mg/dL — ABNORMAL LOW (ref 8.9–10.3)
Chloride: 116 mmol/L — ABNORMAL HIGH (ref 98–111)
Creatinine, Ser: 1 mg/dL (ref 0.44–1.00)
GFR, Estimated: 56 mL/min — ABNORMAL LOW (ref >60)
Glucose, Bld: 110 mg/dL — ABNORMAL HIGH (ref 70–99)
Potassium: 3.6 mmol/L (ref 3.5–5.1)
Sodium: 145 mmol/L (ref 135–145)

## 2022-01-04 LAB — CBC
HCT: 32.6 % — ABNORMAL LOW (ref 36.0–46.0)
Hemoglobin: 10.2 g/dL — ABNORMAL LOW (ref 12.0–15.0)
MCH: 29.5 pg (ref 26.0–34.0)
MCHC: 31.3 g/dL (ref 30.0–36.0)
MCV: 94.2 fL (ref 80.0–100.0)
Platelets: 152 10*3/uL (ref 150–400)
RBC: 3.46 MIL/uL — ABNORMAL LOW (ref 3.87–5.11)
RDW: 13.6 % (ref 11.5–15.5)
WBC: 10.7 10*3/uL — ABNORMAL HIGH (ref 4.0–10.5)
nRBC: 0 % (ref 0.0–0.2)

## 2022-01-04 LAB — MRSA NEXT GEN BY PCR, NASAL: MRSA by PCR Next Gen: NOT DETECTED

## 2022-01-04 LAB — MAGNESIUM: Magnesium: 2.3 mg/dL (ref 1.7–2.4)

## 2022-01-04 MED ORDER — POTASSIUM CHLORIDE IN NACL 20-0.45 MEQ/L-% IV SOLN: INTRAVENOUS | Status: AC

## 2022-01-04 NOTE — Progress Notes (Signed)
PROGRESS NOTE  Cassie Hanson DPO:[id number] DOB: 1936-03-09 DOA: 01/02/2022 PCP: Sinda Du, MD  Brief History:  86 year old female with a history of dementia, hypertension, COPD, GERD presenting from [facility] with an aspiration event.  At baseline, the patient is confused and only speaks a few words and frequently mumbles.  Apparently, the patient has been on pured diet and had 2 choking episodes prior to admission.  She developed hypoxia with oxygen saturation in 70s.  And shortness of breath.  She was placed by EMS on 4 L nasal cannula and taken to the emergency department for further evaluation.  The patient herself is unable to provide any history.  In speaking with the patient's sister, the patient is normally able to ambulate without assistance and she is able to feed herself.  At baseline, the patient is on a pured diet. In the emergency department, the patient was afebrile hemodynamically stable.  Oxygen saturation was 95% on 4 L.  WBC 11.1, hemoglobin 12.0, platelets 174.  Sodium 142, potassium 4.4, bicarbonate 24, serum creatinine 1.11.  ABG showed pH 7.36/50/<31/27.  Patient was started on Unasyn and azithromycin.     Assessment and Plan: * Acute respiratory failure with hypoxia (HCC) Presented with shortness of breath and hypoxia in the 70s Secondary to aspiration pneumonia Continue Unasyn and azithromycin Now stable on RA  Aspiration pneumonia (HCC) Continue Unasyn and azithromycin Speech therapy evaluation>>dys 1 with honey thickened liquid PCT 2.16  Dementia ([place]) At baseline the patient is able to ambulate and feed herself She is able to order her name but usually mumbles other things  Depression Holding BuSpar and Zoloft temporarily Holding Remeron  Anxiety Holding BuSpar and Zoloft temporarily  Essential hypertension Holding lisinopril BP remains well controlled  Family Communication:   sister updated 9/8   Consultants:  none   Code  Status:  DNR   DVT Prophylaxis:  [place] Lovenox     Procedures: As Listed in Progress Note Above   Antibiotics: Unasyn 9/7>> Azithro 9/7>>          Subjective: Pt is awake and alert.  Mumbles.  ROS not possible  Objective: Vitals:   01/03/22 1534 01/03/22 2113 01/04/22 0541 01/04/22 1300  BP:  (!) 143/98 106/63 121/66  Pulse:  80 (!) 55 62  Resp:  '16 16 17  '$ Temp:  98.1 F (36.7 C) 97.9 F (36.6 C) 98 F (36.7 C)  TempSrc:  Axillary Axillary Axillary  SpO2: (!) 89% 100% 98% 100%  Weight:      Height:        Intake/Output Summary (Last 24 hours) at 01/04/2022 1724 Last data filed at 01/04/2022 1300 Gross per 24 hour  Intake 315 ml  Output 1200 ml  Net -885 ml   Weight change:  Exam:  General:  Pt is alert, does not follow commands appropriately, not in acute distress HEENT: No icterus, No thrush, No neck mass, [place]/AT Cardiovascular: RRR, S1/S2, no rubs, no gallops Respiratory: bibasilar rales. No wheeze Abdomen: Soft/+BS, non tender, non distended, no guarding Extremities: No edema, No lymphangitis, No petechiae, No rashes, no synovitis   Data Reviewed: I have personally reviewed following labs and imaging studies Basic Metabolic Panel: Recent Labs  Lab 01/02/22 2144 01/03/22 0432 01/04/22 0500  NA 142 142 145  K 4.4 3.9 3.6  CL 110 110 116*  CO2 '24 22 24  '$ GLUCOSE 120* 122* 110*  BUN 36* 32* 29*  CREATININE  1.11* 1.05* 1.00  CALCIUM 9.0 8.6* 8.7*  MG  --  2.1 2.3  PHOS  --  3.4  --    Liver Function Tests: Recent Labs  Lab 01/02/22 2144 01/03/22 0432  AST 38 30  ALT 24 23  ALKPHOS 69 65  BILITOT 0.9 0.9  PROT 7.6 6.9  ALBUMIN 3.9 3.4*   No results for input(s): "LIPASE", "AMYLASE" in the last 168 hours. No results for input(s): "AMMONIA" in the last 168 hours. Coagulation Profile: No results for input(s): "INR", "PROTIME" in the last 168 hours. CBC: Recent Labs  Lab 01/02/22 2144 01/03/22 0432 01/04/22 0500  WBC 11.1* 13.7* 10.7*   NEUTROABS 9.6*  --   --   HGB 12.0 10.5* 10.2*  HCT 37.6 32.4* 32.6*  MCV 92.2 91.0 94.2  PLT 174 164 152   Cardiac Enzymes: No results for input(s): "CKTOTAL", "CKMB", "CKMBINDEX", "TROPONINI" in the last 168 hours. BNP: Invalid input(s): "POCBNP" CBG: No results for input(s): "GLUCAP" in the last 168 hours. HbA1C: No results for input(s): "HGBA1C" in the last 72 hours. Urine analysis: No results found for: "COLORURINE", "APPEARANCEUR", "LABSPEC", "PHURINE", "GLUCOSEU", "HGBUR", "BILIRUBINUR", "KETONESUR", "PROTEINUR", "UROBILINOGEN", "NITRITE", "LEUKOCYTESUR" Sepsis Labs: '@LABRCNTIP'$ (procalcitonin:4,lacticidven:4) ) Recent Results (from the past 240 hour(s))  Resp Panel by RT-PCR (Flu A&B, Covid) Anterior Nasal Swab     Status: None   Collection Time: 01/02/22  8:15 PM   Specimen: Anterior Nasal Swab  Result Value Ref Range Status   SARS Coronavirus 2 by RT PCR NEGATIVE NEGATIVE Final    Comment: (NOTE) SARS-CoV-2 target nucleic acids are NOT DETECTED.  The SARS-CoV-2 RNA is generally detectable in upper respiratory specimens during the acute phase of infection. The lowest concentration of SARS-CoV-2 viral copies this assay can detect is 138 copies/mL. A negative result does not preclude SARS-Cov-2 infection and should not be used as the sole basis for treatment or other patient management decisions. A negative result may occur with  improper specimen collection/handling, submission of specimen other than nasopharyngeal swab, presence of viral mutation(s) within the areas targeted by this assay, and inadequate number of viral copies(<138 copies/mL). A negative result must be combined with clinical observations, patient history, and epidemiological information. The expected result is Negative.  Fact Sheet for Patients:  EntrepreneurPulse.com.au  Fact Sheet for Healthcare Providers:  IncredibleEmployment.be  This test is no t yet  approved or cleared by the [place] FDA and  has been authorized for detection and/or diagnosis of SARS-CoV-2 by FDA under an Emergency Use Authorization (EUA). This EUA will remain  in effect (meaning this test can be used) for the duration of the COVID-19 declaration under Section 564(b)(1) of the Act, 21 U.S.C.section 360bbb-3(b)(1), unless the authorization is terminated  or revoked sooner.       Influenza A by PCR NEGATIVE NEGATIVE Final   Influenza B by PCR NEGATIVE NEGATIVE Final    Comment: (NOTE) The Xpert Xpress SARS-CoV-2/FLU/RSV plus assay is intended as an aid in the diagnosis of influenza from Nasopharyngeal swab specimens and should not be used as a sole basis for treatment. Nasal washings and aspirates are unacceptable for Xpert Xpress SARS-CoV-2/FLU/RSV testing.  Fact Sheet for Patients: EntrepreneurPulse.com.au  Fact Sheet for Healthcare Providers: IncredibleEmployment.be  This test is not yet approved or cleared by the [place] FDA and has been authorized for detection and/or diagnosis of SARS-CoV-2 by FDA under an Emergency Use Authorization (EUA). This EUA will remain in effect (meaning this test can be used) for the  duration of the COVID-19 declaration under Section 564(b)(1) of the Act, 21 U.S.C. section 360bbb-3(b)(1), unless the authorization is terminated or revoked.  Performed at [facility], [address], [place], [place] [address]   Culture, blood (Routine X 2) w Reflex to ID Panel     Status: None (Preliminary result)   Collection Time: 01/03/22  4:32 AM   Specimen: BLOOD  Result Value Ref Range Status   Specimen Description BLOOD  Final   Special Requests NONE BLOOD RIGHT HAND  Final   Culture   Final    NO GROWTH < 24 HOURS Performed at [facility], [address] [place], [place] [address]    Report Status PENDING  Incomplete  Culture, blood (Routine X 2) w Reflex to ID Panel     Status:  None (Preliminary result)   Collection Time: 01/03/22  4:32 AM   Specimen: BLOOD LEFT HAND  Result Value Ref Range Status   Specimen Description   Final    BLOOD LEFT HAND BOTTLES DRAWN AEROBIC AND ANAEROBIC   Special Requests Blood Culture adequate volume  Final   Culture   Final    NO GROWTH < 24 HOURS Performed at [facility], [address] [place], [place] [address]    Report Status PENDING  Incomplete  MRSA Next Gen by PCR, Nasal     Status: None   Collection Time: 01/04/22  2:59 AM   Specimen: Nasal Mucosa; Nasal Swab  Result Value Ref Range Status   MRSA by PCR Next Gen NOT DETECTED NOT DETECTED Final    Comment: (NOTE) The GeneXpert MRSA Assay (FDA approved for NASAL specimens only), is one component of a comprehensive MRSA colonization surveillance program. It is not intended to diagnose MRSA infection nor to guide or monitor treatment for MRSA infections. Test performance is not FDA approved in patients less than 64 years old. Performed at [facility], [address], [place], [place] [address]      Scheduled Meds:  enoxaparin (LOVENOX) injection  40 mg Subcutaneous Q24H   pantoprazole (PROTONIX) IV  40 mg Intravenous Q24H   Continuous Infusions:  ampicillin-sulbactam (UNASYN) IV 3 g (01/04/22 1644)   azithromycin 500 mg (01/03/22 2150)    Procedures/Studies: CT Head Wo Contrast  Result Date: 01/02/2022 CLINICAL DATA:  Mental status change, unknown cause EXAM: CT HEAD WITHOUT CONTRAST TECHNIQUE: Contiguous axial images were obtained from the base of the skull through the vertex without intravenous contrast. RADIATION DOSE REDUCTION: This exam was performed according to the departmental dose-optimization program which includes automated exposure control, adjustment of the mA and/or kV according to patient size and/or use of iterative reconstruction technique. COMPARISON:  CT head 12/10/2021 BRAIN: BRAIN Cerebral ventricle sizes are concordant with the degree of  cerebral volume loss. No evidence of large-territorial acute infarction. No parenchymal hemorrhage. No mass lesion. No extra-axial collection. No mass effect or midline shift. No hydrocephalus. Basilar cisterns are patent. Vascular: No hyperdense vessel. Skull: No acute fracture or focal lesion. Subacute nasal bone fracture. Sinuses/Orbits: Paranasal sinuses and mastoid air cells are clear. Bilateral lens replacement. Otherwise the orbits are unremarkable. Other: None. IMPRESSION: No acute intracranial abnormality. Electronically Signed   By: Iven Finn M.D.   On: 01/02/2022 22:56   CT Angio Chest PE W and/or Wo Contrast  Result Date: 01/02/2022 CLINICAL DATA:  Pulmonary embolism (PE) suspected, high prob. Pt BIB [facility] from nursing home, called out for "choking". Upon arrival choking episode had resolved. Pt has hx of  severe dementia, falls, and COPD, EMS reports lung sounds are junky, suspecting aspiration PNA. EXAM: CT ANGIOGRAPHY CHEST WITH CONTRAST TECHNIQUE: Multidetector CT imaging of the chest was performed using the standard protocol during bolus administration of intravenous contrast. Multiplanar CT image reconstructions and MIPs were obtained to evaluate the vascular anatomy. RADIATION DOSE REDUCTION: This exam was performed according to the departmental dose-optimization program which includes automated exposure control, adjustment of the mA and/or kV according to patient size and/or use of iterative reconstruction technique. CONTRAST:  82m OMNIPAQUE IOHEXOL 350 MG/ML SOLN COMPARISON:  None Available. FINDINGS: Cardiovascular: Satisfactory opacification of the pulmonary arteries to the segmental level. No evidence of pulmonary embolism at the central and proximal segmental levels. Limited evaluation more distally due to motion artifact and lung findings. Normal heart size. No significant pericardial effusion. The thoracic aorta is normal in caliber. Moderate to severe atherosclerotic plaque of  the thoracic aorta. Left anterior descending coronary artery calcifications. Mitral annular calcification. Mediastinum/Nodes: Prominent but nonenlarged right hilar and mediastinal lymph nodes. No enlarged mediastinal, hilar, or axillary lymph nodes. Thyroid gland, trachea, and esophagus demonstrate no significant findings. Small hiatal hernia. Lungs/Pleura: Bilateral lower lobe, left greater than right peribronchovascular patchy airspace opacities. To lesser extent similar findings within the bilateral upper lobes and right middle lobe. No pulmonary nodule. No pulmonary mass. No pleural effusion. No pneumothorax. Upper Abdomen: No acute abnormality. Musculoskeletal: No chest wall abnormality. No suspicious lytic or blastic osseous lesions. No acute displaced fracture. Multilevel degenerative changes of the spine. Review of the MIP images confirms the above findings. IMPRESSION: 1. No evidence of pulmonary embolism at the central and proximal segmental levels. Limited evaluation more distally due to motion artifact and lung disease. 2. Peribronchovascular patchy airspace opacities suggestive of infection/inflammation. 3. Small hiatal hernia. 4. Aortic Atherosclerosis (ICD10-I70.0) including left anterior descending coronary artery and mitral annular calcifications. Electronically Signed   By: MIven FinnM.D.   On: 01/02/2022 22:47   DG Chest Portable 1 View  Result Date: 01/02/2022 CLINICAL DATA:  Possible aspiration. EXAM: PORTABLE CHEST 1 VIEW COMPARISON:  None Available. FINDINGS: The heart size and mediastinal contours are within normal limits. Mild to moderate severity diffuse, chronic appearing increased interstitial lung markings are seen. Mild to moderate severity areas of atelectasis and/or infiltrate are seen within the bilateral lung bases. There is no evidence of a pleural effusion or pneumothorax. Multilevel degenerative changes are noted throughout the thoracic spine. IMPRESSION: Chronic  appearing increased interstitial lung markings with mild to moderate severity bibasilar atelectasis and/or infiltrate. Electronically Signed   By: TVirgina NorfolkM.D.   On: 01/02/2022 20:19   DG Wrist Complete Left  Result Date: 12/10/2021 CLINICAL DATA:  Left wrist pain and bruising following fall, initial encounter EXAM: LEFT WRIST - COMPLETE 3+ VIEW COMPARISON:  None Available. FINDINGS: Widening of the scapholunate space is noted consistent with ligamentous injury likely of a chronic nature. Degenerative changes of the first [facility] are seen. No acute fracture or dislocation is seen. No soft tissue abnormality is noted. IMPRESSION: Degenerative changes without acute abnormality. Widening of the scapholunate space is seen as described. Electronically Signed   By: MInez CatalinaM.D.   On: 12/10/2021 22:40   DG Lumbar Spine Complete  Result Date: 12/10/2021 CLINICAL DATA:  History of recent fall with back pain, initial encounter EXAM: LUMBAR SPINE - COMPLETE 4+ VIEW COMPARISON:  None Available. FINDINGS: Five lumbar type vertebral bodies are well visualized. Multilevel osteophytic changes and facet hypertrophic changes are  seen. No compression deformity is noted. No anterolisthesis is seen. Multilevel disc space narrowing is noted. Surrounding soft tissue structures are within normal limits. IMPRESSION: Multilevel degenerative change without acute abnormality. Electronically Signed   By: Inez Catalina M.D.   On: 12/10/2021 22:39   CT Head Wo Contrast  Result Date: 12/10/2021 CLINICAL DATA:  Neck trauma (Age >= 65y); Head trauma, minor (Age >= 65y). Nursing home reported to EMS that pt fell and hit the right side of her head. A bluish hematoma is visible. EXAM: CT HEAD WITHOUT CONTRAST CT CERVICAL SPINE WITHOUT CONTRAST TECHNIQUE: Multidetector CT imaging of the head and cervical spine was performed following the standard protocol without intravenous contrast. Multiplanar CT image reconstructions of  the cervical spine were also generated. RADIATION DOSE REDUCTION: This exam was performed according to the departmental dose-optimization program which includes automated exposure control, adjustment of the mA and/or kV according to patient size and/or use of iterative reconstruction technique. COMPARISON:  CT head and cervical spine 04/01/2019 FINDINGS: CT HEAD FINDINGS BRAIN: BRAIN Cerebral ventricle sizes are concordant with the degree of cerebral volume loss. Patchy and confluent areas of decreased attenuation are noted throughout the deep and periventricular white matter of the cerebral hemispheres bilaterally, compatible with chronic microvascular ischemic disease. No evidence of large-territorial acute infarction. No parenchymal hemorrhage. No mass lesion. No extra-axial collection. No mass effect or midline shift. No hydrocephalus. Basilar cisterns are patent. Vascular: No hyperdense vessel. Atherosclerotic calcifications are present within the cavernous internal carotid arteries. Skull: Acute comminuted right nasal bone fracture. Otherwise no acute fracture or focal lesion. Sinuses/Orbits: Paranasal sinuses and mastoid air cells are clear. Bilateral lens replacement. Otherwise the orbits are unremarkable. Other: Right periorbital subcutaneus soft tissue hematoma. Marked scalp 7 mm subcutaneus soft tissue hematoma. CT CERVICAL SPINE FINDINGS Alignment: Grade 1 anterolisthesis of C3 on C4. Skull base and vertebrae: Multilevel degenerative changes of the spine. No associated severe osseous neural foraminal or central canal stenosis. No acute fracture. No aggressive appearing focal osseous lesion or focal pathologic process. Soft tissues and spinal canal: No prevertebral fluid or swelling. No visible canal hematoma. Upper chest: Unremarkable. Other: Atherosclerotic plaque of the carotid arteries within neck. IMPRESSION: 1. No acute intracranial abnormality. 2. No acute displaced fracture or traumatic listhesis  of the cervical spine. 3. Acute comminuted right nasal bone fracture. Electronically Signed   By: Iven Finn M.D.   On: 12/10/2021 22:23   CT Cervical Spine Wo Contrast  Result Date: 12/10/2021 CLINICAL DATA:  Neck trauma (Age >= 65y); Head trauma, minor (Age >= 65y). Nursing home reported to EMS that pt fell and hit the right side of her head. A bluish hematoma is visible. EXAM: CT HEAD WITHOUT CONTRAST CT CERVICAL SPINE WITHOUT CONTRAST TECHNIQUE: Multidetector CT imaging of the head and cervical spine was performed following the standard protocol without intravenous contrast. Multiplanar CT image reconstructions of the cervical spine were also generated. RADIATION DOSE REDUCTION: This exam was performed according to the departmental dose-optimization program which includes automated exposure control, adjustment of the mA and/or kV according to patient size and/or use of iterative reconstruction technique. COMPARISON:  CT head and cervical spine 04/01/2019 FINDINGS: CT HEAD FINDINGS BRAIN: BRAIN Cerebral ventricle sizes are concordant with the degree of cerebral volume loss. Patchy and confluent areas of decreased attenuation are noted throughout the deep and periventricular white matter of the cerebral hemispheres bilaterally, compatible with chronic microvascular ischemic disease. No evidence of large-territorial acute infarction. No parenchymal hemorrhage. No mass  lesion. No extra-axial collection. No mass effect or midline shift. No hydrocephalus. Basilar cisterns are patent. Vascular: No hyperdense vessel. Atherosclerotic calcifications are present within the cavernous internal carotid arteries. Skull: Acute comminuted right nasal bone fracture. Otherwise no acute fracture or focal lesion. Sinuses/Orbits: Paranasal sinuses and mastoid air cells are clear. Bilateral lens replacement. Otherwise the orbits are unremarkable. Other: Right periorbital subcutaneus soft tissue hematoma. Marked scalp 7 mm  subcutaneus soft tissue hematoma. CT CERVICAL SPINE FINDINGS Alignment: Grade 1 anterolisthesis of C3 on C4. Skull base and vertebrae: Multilevel degenerative changes of the spine. No associated severe osseous neural foraminal or central canal stenosis. No acute fracture. No aggressive appearing focal osseous lesion or focal pathologic process. Soft tissues and spinal canal: No prevertebral fluid or swelling. No visible canal hematoma. Upper chest: Unremarkable. Other: Atherosclerotic plaque of the carotid arteries within neck. IMPRESSION: 1. No acute intracranial abnormality. 2. No acute displaced fracture or traumatic listhesis of the cervical spine. 3. Acute comminuted right nasal bone fracture. Electronically Signed   By: Iven Finn M.D.   On: 12/10/2021 22:23    Orson Eva, DO  [facility]  If 7PM-7AM, please contact night-coverage [email or web address] Password [email or web address] 01/04/2022, 5:24 PM   LOS: 2 days

## 2022-01-05 DIAGNOSIS — J690 Pneumonitis due to inhalation of food and vomit: Secondary | ICD-10-CM | POA: Diagnosis not present

## 2022-01-05 DIAGNOSIS — J9601 Acute respiratory failure with hypoxia: Secondary | ICD-10-CM | POA: Diagnosis not present

## 2022-01-05 DIAGNOSIS — I10 Essential (primary) hypertension: Secondary | ICD-10-CM | POA: Diagnosis not present

## 2022-01-05 MED ORDER — AZITHROMYCIN 500 MG PO TABS: 500.0000 mg | ORAL_TABLET | Freq: Every day | ORAL | 0 refills | Status: AC

## 2022-01-05 MED ORDER — AMOXICILLIN-POT CLAVULANATE 875-125 MG PO TABS
1.0000 | ORAL_TABLET | Freq: Two times a day (BID) | ORAL | Status: DC
  Administered 2022-01-05: 1 via ORAL
  Filled 2022-01-05: qty 1

## 2022-01-05 MED ORDER — AMOXICILLIN-POT CLAVULANATE 875-125 MG PO TABS: 1.0000 | ORAL_TABLET | Freq: Two times a day (BID) | ORAL | 0 refills | Status: AC

## 2022-01-05 MED ORDER — AZITHROMYCIN 250 MG PO TABS
500.0000 mg | ORAL_TABLET | ORAL | Status: DC
Start: 2022-01-05 — End: 2022-01-05

## 2022-01-05 NOTE — Progress Notes (Signed)
Nsg Discharge Note  Admit Date:  01/02/2022 Discharge date: 08/03/6806   Jefm Bryant to be D/C'd [place] per MD order.  AVS completed.    Discharge Medication: Allergies as of 01/05/2022       Reactions   Keflex [cephalexin]         Medication List     STOP taking these medications    cefTRIAXone 1 g injection Commonly known as: ROCEPHIN   lisinopril 10 MG tablet Commonly known as: ZESTRIL   omeprazole 20 MG capsule Commonly known as: PRILOSEC       TAKE these medications    acetaminophen 650 MG suppository Commonly known as: TYLENOL Place 650 mg rectally every 4 (four) hours as needed for moderate pain. Temp over 100 or above   amoxicillin-clavulanate 875-125 MG tablet Commonly known as: AUGMENTIN Take 1 tablet by mouth every 12 (twelve) hours. X 4 days   azithromycin 500 MG tablet Commonly known as: ZITHROMAX Take 1 tablet (500 mg total) by mouth daily. X 3 days   busPIRone 5 MG tablet Commonly known as: BUSPAR Take 5 mg by mouth 2 (two) times daily. What changed: Another medication with the same name was removed. Continue taking this medication, and follow the directions you see here.   CENTRUM SILVER PO Take 1 tablet by mouth daily.   Diabetic Tussin EX 100 MG/5ML liquid Generic drug: guaiFENesin Take 10 mLs by mouth every 6 (six) hours as needed for cough or to loosen phlegm.   ferrous sulfate 325 (65 FE) MG tablet Take 325 mg by mouth daily with breakfast.   levothyroxine 100 MCG tablet Commonly known as: SYNTHROID Take by mouth.   mirtazapine 7.5 MG tablet Commonly known as: REMERON Take 7.5 mg by mouth at bedtime.   senna 8.6 MG Tabs tablet Commonly known as: SENOKOT Take 1 tablet by mouth daily.   sertraline 25 MG tablet Commonly known as: ZOLOFT Take 25 mg by mouth daily.   Vitamin D3 50 MCG (2000 UT) Tabs Take 1 tablet by mouth daily.        Discharge Assessment: Vitals:   01/05/22 0400 01/05/22 0500  BP: (!) 142/80    Pulse: 84   Resp: 18   Temp:  97.7 F (36.5 C)  SpO2: 94%    Skin clean, dry and intact without evidence of skin break down, no evidence of skin tears noted. IV catheter discontinued intact. Site without signs and symptoms of complications - no redness or edema noted at insertion site, patient denies c/o pain - only slight tenderness at site.  Dressing with slight pressure applied.  D/c Instructions-Education: Discharge instructions given to patient/family with verbalized understanding. D/c education completed with patient/family including follow up instructions, medication list, d/c activities limitations if indicated, with other d/c instructions as indicated by MD - patient able to verbalize understanding, all questions fully answered. Patient instructed to return to ED, call 911, or call MD for any changes in condition.  Patient escorted via [place], and D/C home via private auto.  Kathie Rhodes, RN 01/05/2022 11:46 AM

## 2022-01-05 NOTE — Progress Notes (Addendum)
TOC CSW spoke with [place] at [facility].  [facility] has also been contacted.  Call report #:  [phone]  Room #: 500B.  This information has been shared with Bethena Roys, LPN. And Raquel Sarna, Therapist, sports.  Tamaya Pun Tarpley-Carter, MSW, LCSW-A Pronouns:  She/Her/Hers [facility] of Care Clinical Social Worker Direct Number:  [phone] [email or web address]

## 2022-01-05 NOTE — Discharge Summary (Signed)
Physician Discharge Summary   Patient: Cassie Hanson MRN: [id number] DOB: Apr 08, 1936  Admit date:     01/02/2022  Discharge date: 01/05/22  Discharge Physician: Shanon Brow Briceyda Abdullah   PCP: Sinda Du, MD   Recommendations at discharge:   Please follow up with primary care provider within 1-2 weeks  Please repeat BMP and CBC in one week  Hospital Course: 86 year old female with a history of dementia, hypertension, COPD, GERD presenting from [facility] with an aspiration event.  At baseline, the patient is confused and only speaks a few words and frequently mumbles.  Apparently, the patient has been on pured diet and had 2 choking episodes prior to admission.  She developed hypoxia with oxygen saturation in 70s.  And shortness of breath.  She was placed by EMS on 4 L nasal cannula and taken to the emergency department for further evaluation.  The patient herself is unable to provide any history.  In speaking with the patient's sister, the patient is normally able to ambulate without assistance and she is able to feed herself.  At baseline, the patient is on a pured diet. In the emergency department, the patient was afebrile hemodynamically stable.  Oxygen saturation was 95% on 4 L.  WBC 11.1, hemoglobin 12.0, platelets 174.  Sodium 142, potassium 4.4, bicarbonate 24, serum creatinine 1.11.  ABG showed pH 7.36/50/<31/27.  Patient was started on Unasyn and azithromycin.  Assessment and Plan: * Acute respiratory failure with hypoxia (HCC) Presented with shortness of breath and hypoxia in the 70s Secondary to aspiration pneumonia Continue Unasyn and azithromycin>>d/c with 4 days of amox/clav & 3 days Now stable on RA I discussed with patient's sister (HPOA) the goals of care>>she values quality of life and does not want such extreme limitations on patient's diet--she is willing to accept the risks of aspiration and understands risks/benefits -I will allow a liberalized diet based upon my discussion  with sister  Aspiration pneumonia ([place]) Continue Unasyn and azithromycin>>d/c with 4 days of amox/clav & 3 days azithro Speech therapy evaluation>>dys 1 with honey thickened liquid PCT 2.16 I discussed with patient's sister (HPOA) the goals of care>>she values quality of life and does not want such extreme limitations on patient's diet--she is willing to accept the risks of aspiration and understands risks/benefits -I will allow a liberalized diet based upon my discussion with sister -please sit up with meals, no meals in bed please -oral care before and after meals -plan to d/c with dys 3 diet with thin liquids>>sister agreeable  Dementia ([place]) At baseline the patient is able to ambulate and feed herself She is able to order her name but usually mumbles other things  Depression Holding Zoloft temporarily>>restart Holding Remeron>>restart  Anxiety Holding Zoloft temporarily>>restart  Essential hypertension Holding lisinopril BP remains well controlled         Consultants: none Procedures performed: none  Disposition: Skilled nursing facility Diet recommendation:  Dysphagia type 3 Thin  Liquid DISCHARGE MEDICATION: Allergies as of 01/05/2022       Reactions   Keflex [cephalexin]         Medication List     STOP taking these medications    cefTRIAXone 1 g injection Commonly known as: ROCEPHIN   lisinopril 10 MG tablet Commonly known as: ZESTRIL   omeprazole 20 MG capsule Commonly known as: PRILOSEC       TAKE these medications    acetaminophen 650 MG suppository Commonly known as: TYLENOL Place 650 mg rectally every 4 (four) hours as  needed for moderate pain. Temp over 100 or above   amoxicillin-clavulanate 875-125 MG tablet Commonly known as: AUGMENTIN Take 1 tablet by mouth every 12 (twelve) hours. X 4 days   azithromycin 500 MG tablet Commonly known as: ZITHROMAX Take 1 tablet (500 mg total) by mouth daily. X 3 days   busPIRone 5 MG  tablet Commonly known as: BUSPAR Take 5 mg by mouth 2 (two) times daily. What changed: Another medication with the same name was removed. Continue taking this medication, and follow the directions you see here.   CENTRUM SILVER PO Take 1 tablet by mouth daily.   Diabetic Tussin EX 100 MG/5ML liquid Generic drug: guaiFENesin Take 10 mLs by mouth every 6 (six) hours as needed for cough or to loosen phlegm.   ferrous sulfate 325 (65 FE) MG tablet Take 325 mg by mouth daily with breakfast.   levothyroxine 100 MCG tablet Commonly known as: SYNTHROID Take by mouth.   mirtazapine 7.5 MG tablet Commonly known as: REMERON Take 7.5 mg by mouth at bedtime.   senna 8.6 MG Tabs tablet Commonly known as: SENOKOT Take 1 tablet by mouth daily.   sertraline 25 MG tablet Commonly known as: ZOLOFT Take 25 mg by mouth daily.   Vitamin D3 50 MCG (2000 UT) Tabs Take 1 tablet by mouth daily.        Discharge Exam: Filed Weights   01/02/22 1932  Weight: 58.1 kg  HEENT:  [place]/AT, No thrush, no icterus CV:  RRR, no rub, no S3, no S4 Lung:  bibasilar rales. No wheeze Abd:  soft/+BS, NT Ext:  No edema, no lymphangitis, no synovitis, no rash    Condition at discharge: stable  The results of significant diagnostics from this hospitalization (including imaging, microbiology, ancillary and laboratory) are listed below for reference.   Imaging Studies: CT Head Wo Contrast  Result Date: 01/02/2022 CLINICAL DATA:  Mental status change, unknown cause EXAM: CT HEAD WITHOUT CONTRAST TECHNIQUE: Contiguous axial images were obtained from the base of the skull through the vertex without intravenous contrast. RADIATION DOSE REDUCTION: This exam was performed according to the departmental dose-optimization program which includes automated exposure control, adjustment of the mA and/or kV according to patient size and/or use of iterative reconstruction technique. COMPARISON:  CT head 12/10/2021 BRAIN: BRAIN  Cerebral ventricle sizes are concordant with the degree of cerebral volume loss. No evidence of large-territorial acute infarction. No parenchymal hemorrhage. No mass lesion. No extra-axial collection. No mass effect or midline shift. No hydrocephalus. Basilar cisterns are patent. Vascular: No hyperdense vessel. Skull: No acute fracture or focal lesion. Subacute nasal bone fracture. Sinuses/Orbits: Paranasal sinuses and mastoid air cells are clear. Bilateral lens replacement. Otherwise the orbits are unremarkable. Other: None. IMPRESSION: No acute intracranial abnormality. Electronically Signed   By: Iven Finn M.D.   On: 01/02/2022 22:56   CT Angio Chest PE W and/or Wo Contrast  Result Date: 01/02/2022 CLINICAL DATA:  Pulmonary embolism (PE) suspected, high prob. Pt BIB [facility] from nursing home, called out for "choking". Upon arrival choking episode had resolved. Pt has hx of severe dementia, falls, and COPD, EMS reports lung sounds are junky, suspecting aspiration PNA. EXAM: CT ANGIOGRAPHY CHEST WITH CONTRAST TECHNIQUE: Multidetector CT imaging of the chest was performed using the standard protocol during bolus administration of intravenous contrast. Multiplanar CT image reconstructions and MIPs were obtained to evaluate the vascular anatomy. RADIATION DOSE REDUCTION: This exam was performed according to the departmental dose-optimization program which includes automated exposure  control, adjustment of the mA and/or kV according to patient size and/or use of iterative reconstruction technique. CONTRAST:  3m OMNIPAQUE IOHEXOL 350 MG/ML SOLN COMPARISON:  None Available. FINDINGS: Cardiovascular: Satisfactory opacification of the pulmonary arteries to the segmental level. No evidence of pulmonary embolism at the central and proximal segmental levels. Limited evaluation more distally due to motion artifact and lung findings. Normal heart size. No significant pericardial effusion. The thoracic aorta is normal  in caliber. Moderate to severe atherosclerotic plaque of the thoracic aorta. Left anterior descending coronary artery calcifications. Mitral annular calcification. Mediastinum/Nodes: Prominent but nonenlarged right hilar and mediastinal lymph nodes. No enlarged mediastinal, hilar, or axillary lymph nodes. Thyroid gland, trachea, and esophagus demonstrate no significant findings. Small hiatal hernia. Lungs/Pleura: Bilateral lower lobe, left greater than right peribronchovascular patchy airspace opacities. To lesser extent similar findings within the bilateral upper lobes and right middle lobe. No pulmonary nodule. No pulmonary mass. No pleural effusion. No pneumothorax. Upper Abdomen: No acute abnormality. Musculoskeletal: No chest wall abnormality. No suspicious lytic or blastic osseous lesions. No acute displaced fracture. Multilevel degenerative changes of the spine. Review of the MIP images confirms the above findings. IMPRESSION: 1. No evidence of pulmonary embolism at the central and proximal segmental levels. Limited evaluation more distally due to motion artifact and lung disease. 2. Peribronchovascular patchy airspace opacities suggestive of infection/inflammation. 3. Small hiatal hernia. 4. Aortic Atherosclerosis (ICD10-I70.0) including left anterior descending coronary artery and mitral annular calcifications. Electronically Signed   By: MIven FinnM.D.   On: 01/02/2022 22:47   DG Chest Portable 1 View  Result Date: 01/02/2022 CLINICAL DATA:  Possible aspiration. EXAM: PORTABLE CHEST 1 VIEW COMPARISON:  None Available. FINDINGS: The heart size and mediastinal contours are within normal limits. Mild to moderate severity diffuse, chronic appearing increased interstitial lung markings are seen. Mild to moderate severity areas of atelectasis and/or infiltrate are seen within the bilateral lung bases. There is no evidence of a pleural effusion or pneumothorax. Multilevel degenerative changes are noted  throughout the thoracic spine. IMPRESSION: Chronic appearing increased interstitial lung markings with mild to moderate severity bibasilar atelectasis and/or infiltrate. Electronically Signed   By: TVirgina NorfolkM.D.   On: 01/02/2022 20:19   DG Wrist Complete Left  Result Date: 12/10/2021 CLINICAL DATA:  Left wrist pain and bruising following fall, initial encounter EXAM: LEFT WRIST - COMPLETE 3+ VIEW COMPARISON:  None Available. FINDINGS: Widening of the scapholunate space is noted consistent with ligamentous injury likely of a chronic nature. Degenerative changes of the first [facility] Hospital[facility] are seen. No acute fracture or dislocation is seen. No soft tissue abnormality is noted. IMPRESSION: Degenerative changes without acute abnormality. Widening of the scapholunate space is seen as described. Electronically Signed   By: MInez CatalinaM.D.   On: 12/10/2021 22:40   DG Lumbar Spine Complete  Result Date: 12/10/2021 CLINICAL DATA:  History of recent fall with back pain, initial encounter EXAM: LUMBAR SPINE - COMPLETE 4+ VIEW COMPARISON:  None Available. FINDINGS: Five lumbar type vertebral bodies are well visualized. Multilevel osteophytic changes and facet hypertrophic changes are seen. No compression deformity is noted. No anterolisthesis is seen. Multilevel disc space narrowing is noted. Surrounding soft tissue structures are within normal limits. IMPRESSION: Multilevel degenerative change without acute abnormality. Electronically Signed   By: MInez CatalinaM.D.   On: 12/10/2021 22:39   CT Head Wo Contrast  Result Date: 12/10/2021 CLINICAL DATA:  Neck trauma (Age >= 65y); Head trauma, minor (Age >= 65y).  Nursing home reported to EMS that pt fell and hit the right side of her head. A bluish hematoma is visible. EXAM: CT HEAD WITHOUT CONTRAST CT CERVICAL SPINE WITHOUT CONTRAST TECHNIQUE: Multidetector CT imaging of the head and cervical spine was performed following the standard protocol without intravenous  contrast. Multiplanar CT image reconstructions of the cervical spine were also generated. RADIATION DOSE REDUCTION: This exam was performed according to the departmental dose-optimization program which includes automated exposure control, adjustment of the mA and/or kV according to patient size and/or use of iterative reconstruction technique. COMPARISON:  CT head and cervical spine 04/01/2019 FINDINGS: CT HEAD FINDINGS BRAIN: BRAIN Cerebral ventricle sizes are concordant with the degree of cerebral volume loss. Patchy and confluent areas of decreased attenuation are noted throughout the deep and periventricular white matter of the cerebral hemispheres bilaterally, compatible with chronic microvascular ischemic disease. No evidence of large-territorial acute infarction. No parenchymal hemorrhage. No mass lesion. No extra-axial collection. No mass effect or midline shift. No hydrocephalus. Basilar cisterns are patent. Vascular: No hyperdense vessel. Atherosclerotic calcifications are present within the cavernous internal carotid arteries. Skull: Acute comminuted right nasal bone fracture. Otherwise no acute fracture or focal lesion. Sinuses/Orbits: Paranasal sinuses and mastoid air cells are clear. Bilateral lens replacement. Otherwise the orbits are unremarkable. Other: Right periorbital subcutaneus soft tissue hematoma. Marked scalp 7 mm subcutaneus soft tissue hematoma. CT CERVICAL SPINE FINDINGS Alignment: Grade 1 anterolisthesis of C3 on C4. Skull base and vertebrae: Multilevel degenerative changes of the spine. No associated severe osseous neural foraminal or central canal stenosis. No acute fracture. No aggressive appearing focal osseous lesion or focal pathologic process. Soft tissues and spinal canal: No prevertebral fluid or swelling. No visible canal hematoma. Upper chest: Unremarkable. Other: Atherosclerotic plaque of the carotid arteries within neck. IMPRESSION: 1. No acute intracranial abnormality. 2.  No acute displaced fracture or traumatic listhesis of the cervical spine. 3. Acute comminuted right nasal bone fracture. Electronically Signed   By: Iven Finn M.D.   On: 12/10/2021 22:23   CT Cervical Spine Wo Contrast  Result Date: 12/10/2021 CLINICAL DATA:  Neck trauma (Age >= 65y); Head trauma, minor (Age >= 65y). Nursing home reported to EMS that pt fell and hit the right side of her head. A bluish hematoma is visible. EXAM: CT HEAD WITHOUT CONTRAST CT CERVICAL SPINE WITHOUT CONTRAST TECHNIQUE: Multidetector CT imaging of the head and cervical spine was performed following the standard protocol without intravenous contrast. Multiplanar CT image reconstructions of the cervical spine were also generated. RADIATION DOSE REDUCTION: This exam was performed according to the departmental dose-optimization program which includes automated exposure control, adjustment of the mA and/or kV according to patient size and/or use of iterative reconstruction technique. COMPARISON:  CT head and cervical spine 04/01/2019 FINDINGS: CT HEAD FINDINGS BRAIN: BRAIN Cerebral ventricle sizes are concordant with the degree of cerebral volume loss. Patchy and confluent areas of decreased attenuation are noted throughout the deep and periventricular white matter of the cerebral hemispheres bilaterally, compatible with chronic microvascular ischemic disease. No evidence of large-territorial acute infarction. No parenchymal hemorrhage. No mass lesion. No extra-axial collection. No mass effect or midline shift. No hydrocephalus. Basilar cisterns are patent. Vascular: No hyperdense vessel. Atherosclerotic calcifications are present within the cavernous internal carotid arteries. Skull: Acute comminuted right nasal bone fracture. Otherwise no acute fracture or focal lesion. Sinuses/Orbits: Paranasal sinuses and mastoid air cells are clear. Bilateral lens replacement. Otherwise the orbits are unremarkable. Other: Right periorbital  subcutaneus soft tissue hematoma.  Marked scalp 7 mm subcutaneus soft tissue hematoma. CT CERVICAL SPINE FINDINGS Alignment: Grade 1 anterolisthesis of C3 on C4. Skull base and vertebrae: Multilevel degenerative changes of the spine. No associated severe osseous neural foraminal or central canal stenosis. No acute fracture. No aggressive appearing focal osseous lesion or focal pathologic process. Soft tissues and spinal canal: No prevertebral fluid or swelling. No visible canal hematoma. Upper chest: Unremarkable. Other: Atherosclerotic plaque of the carotid arteries within neck. IMPRESSION: 1. No acute intracranial abnormality. 2. No acute displaced fracture or traumatic listhesis of the cervical spine. 3. Acute comminuted right nasal bone fracture. Electronically Signed   By: Iven Finn M.D.   On: 12/10/2021 22:23    Microbiology: Results for orders placed or performed during the hospital encounter of 01/02/22  Resp Panel by RT-PCR (Flu A&B, Covid) Anterior Nasal Swab     Status: None   Collection Time: 01/02/22  8:15 PM   Specimen: Anterior Nasal Swab  Result Value Ref Range Status   SARS Coronavirus 2 by RT PCR NEGATIVE NEGATIVE Final    Comment: (NOTE) SARS-CoV-2 target nucleic acids are NOT DETECTED.  The SARS-CoV-2 RNA is generally detectable in upper respiratory specimens during the acute phase of infection. The lowest concentration of SARS-CoV-2 viral copies this assay can detect is 138 copies/mL. A negative result does not preclude SARS-Cov-2 infection and should not be used as the sole basis for treatment or other patient management decisions. A negative result may occur with  improper specimen collection/handling, submission of specimen other than nasopharyngeal swab, presence of viral mutation(s) within the areas targeted by this assay, and inadequate number of viral copies(<138 copies/mL). A negative result must be combined with clinical observations, patient history, and  epidemiological information. The expected result is Negative.  Fact Sheet for Patients:  EntrepreneurPulse.com.au  Fact Sheet for Healthcare Providers:  IncredibleEmployment.be  This test is no t yet approved or cleared by the [place] FDA and  has been authorized for detection and/or diagnosis of SARS-CoV-2 by FDA under an Emergency Use Authorization (EUA). This EUA will remain  in effect (meaning this test can be used) for the duration of the COVID-19 declaration under Section 564(b)(1) of the Act, 21 U.S.C.section 360bbb-3(b)(1), unless the authorization is terminated  or revoked sooner.       Influenza A by PCR NEGATIVE NEGATIVE Final   Influenza B by PCR NEGATIVE NEGATIVE Final    Comment: (NOTE) The Xpert Xpress SARS-CoV-2/FLU/RSV plus assay is intended as an aid in the diagnosis of influenza from Nasopharyngeal swab specimens and should not be used as a sole basis for treatment. Nasal washings and aspirates are unacceptable for Xpert Xpress SARS-CoV-2/FLU/RSV testing.  Fact Sheet for Patients: EntrepreneurPulse.com.au  Fact Sheet for Healthcare Providers: IncredibleEmployment.be  This test is not yet approved or cleared by the [place] FDA and has been authorized for detection and/or diagnosis of SARS-CoV-2 by FDA under an Emergency Use Authorization (EUA). This EUA will remain in effect (meaning this test can be used) for the duration of the COVID-19 declaration under Section 564(b)(1) of the Act, 21 U.S.C. section 360bbb-3(b)(1), unless the authorization is terminated or revoked.  Performed at [facility], [address], [place],  [address]   Culture, blood (Routine X 2) w Reflex to ID Panel     Status: None (Preliminary result)   Collection Time: 01/03/22  4:32 AM   Specimen: BLOOD  Result Value Ref Range Status   Specimen Description BLOOD  Final  Special Requests NONE  BLOOD RIGHT HAND  Final   Culture   Final    NO GROWTH 2 DAYS Performed at [facility], [address] [place], [place] [address]    Report Status PENDING  Incomplete  Culture, blood (Routine X 2) w Reflex to ID Panel     Status: None (Preliminary result)   Collection Time: 01/03/22  4:32 AM   Specimen: BLOOD LEFT HAND  Result Value Ref Range Status   Specimen Description   Final    BLOOD LEFT HAND BOTTLES DRAWN AEROBIC AND ANAEROBIC   Special Requests Blood Culture adequate volume  Final   Culture   Final    NO GROWTH 2 DAYS Performed at [facility], [address] [place], [place] [address]    Report Status PENDING  Incomplete  MRSA Next Gen by PCR, Nasal     Status: None   Collection Time: 01/04/22  2:59 AM   Specimen: Nasal Mucosa; Nasal Swab  Result Value Ref Range Status   MRSA by PCR Next Gen NOT DETECTED NOT DETECTED Final    Comment: (NOTE) The GeneXpert MRSA Assay (FDA approved for NASAL specimens only), is one component of a comprehensive MRSA colonization surveillance program. It is not intended to diagnose MRSA infection nor to guide or monitor treatment for MRSA infections. Test performance is not FDA approved in patients less than 24 years old. Performed at [facility], [address] [place], [place] [address]     Labs: CBC: Recent Labs  Lab 01/02/22 2144 01/03/22 0432 01/04/22 0500  WBC 11.1* 13.7* 10.7*  NEUTROABS 9.6*  --   --   HGB 12.0 10.5* 10.2*  HCT 37.6 32.4* 32.6*  MCV 92.2 91.0 94.2  PLT 174 164 144   Basic Metabolic Panel: Recent Labs  Lab 01/02/22 2144 01/03/22 0432 01/04/22 0500  NA 142 142 145  K 4.4 3.9 3.6  CL 110 110 116*  CO2 '24 22 24  '$ GLUCOSE 120* 122* 110*  BUN 36* 32* 29*  CREATININE 1.11* 1.05* 1.00  CALCIUM 9.0 8.6* 8.7*  MG  --  2.1 2.3  PHOS  --  3.4  --    Liver Function Tests: Recent Labs  Lab 01/02/22 2144 01/03/22 0432  AST 38 30  ALT 24 23  ALKPHOS 69 65  BILITOT 0.9 0.9  PROT 7.6 6.9  ALBUMIN  3.9 3.4*   CBG: No results for input(s): "GLUCAP" in the last 168 hours.  Discharge time spent: greater than 30 minutes.  Signed: Orson Eva, MD [facility] 01/05/2022

## 2022-01-05 NOTE — Progress Notes (Addendum)
Pharmacy Antibiotic Note  Cassie Hanson is a 86 y.o. female admitted on 01/02/2022 with pneumonia.  Pharmacy has been consulted for unasyn dosing.  Plan: Unasyn 3 grams iv q6h  Height: '5\' 4"'$  (162.6 cm) (per SNF paperwork) Weight: 58.1 kg (128 lb) IBW/kg (Calculated) : 54.7  Temp (24hrs), Avg:97.8 F (36.6 C), Min:97.6 F (36.4 C), Max:98 F (36.7 C)  Recent Labs  Lab 01/02/22 2035 01/02/22 2118 01/02/22 2144 01/02/22 2321 01/03/22 0432 01/04/22 0500  WBC  --   --  11.1*  --  13.7* 10.7*  CREATININE  --   --  1.11*  --  1.05* 1.00  LATICACIDVEN 1.6 1.7  --  1.1  --   --      Estimated Creatinine Clearance: 36.8 mL/min (by C-G formula based on SCr of 1 mg/dL).    Allergies  Allergen Reactions   Keflex [Cephalexin]     Antimicrobials this admission: unasyn 09/07 >>   Azithromycin 9/7 >>  9/8 Bcx: NGTD  9/9 MRSA PCR: negative  9/7 Resp Panel: negative Sputum sample : sent   Thomasenia Sales, PharmD, MBA Clinical Pharmacist  "Be curious, not judgmental..." -Jamal Maes

## 2022-01-08 LAB — CULTURE, BLOOD (ROUTINE X 2)
Culture: NO GROWTH
Culture: NO GROWTH
Special Requests: ADEQUATE

## 2022-02-14 IMAGING — CT CT HEAD W/O CM
3 of 6 series · 16 of 47 positions shown, 19 images · non-contrast
Comparison: 04/01/2019

CLINICAL DATA: Patient fell with laceration to right forehead.

EXAM:
CT HEAD WITHOUT CONTRAST
TECHNIQUE: Contiguous axial images were obtained from the base of the skull
through the vertex without intravenous contrast.

[Series 2: head w o · axial · 0.40mm/px · z∈[+115,+240]mm · 11 of 30 slices shown, 14 images]
[im 3/30  brain]
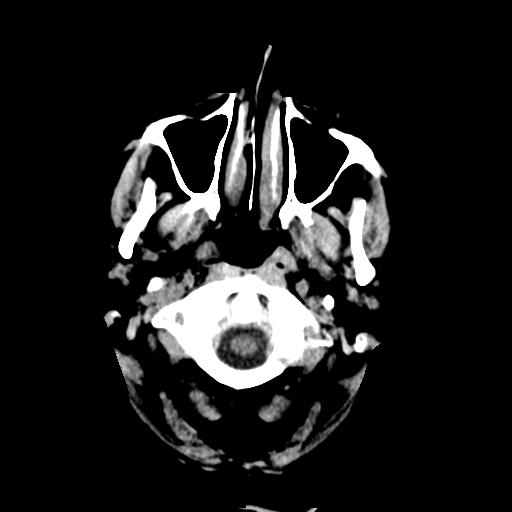
[im 3/30  bone]
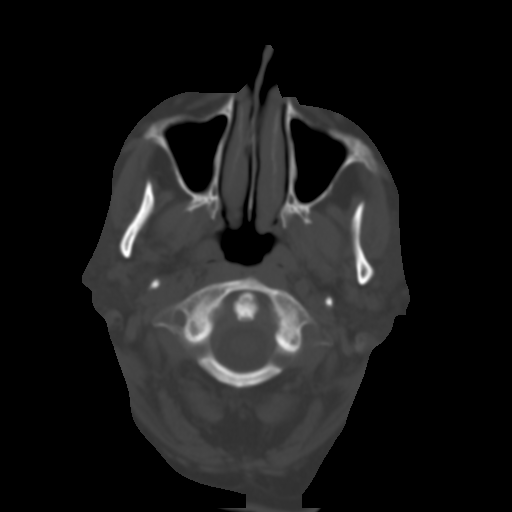
[im 6/30  brain]
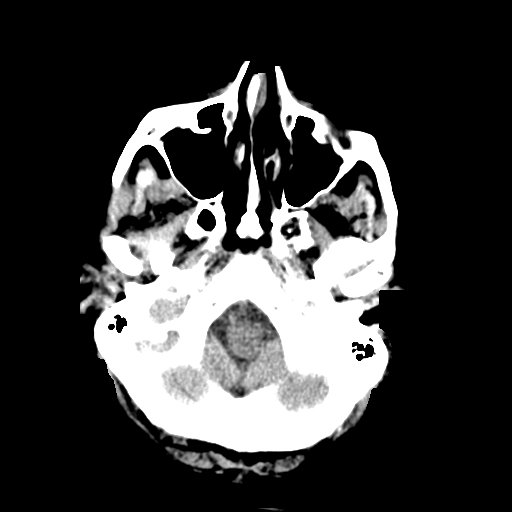
[im 8/30  brain]
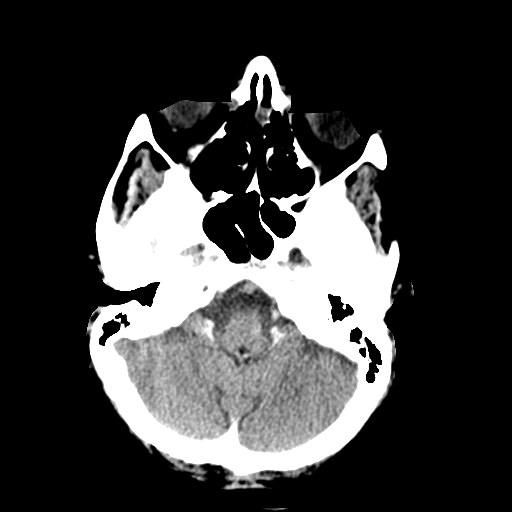
[im 11/30  brain]
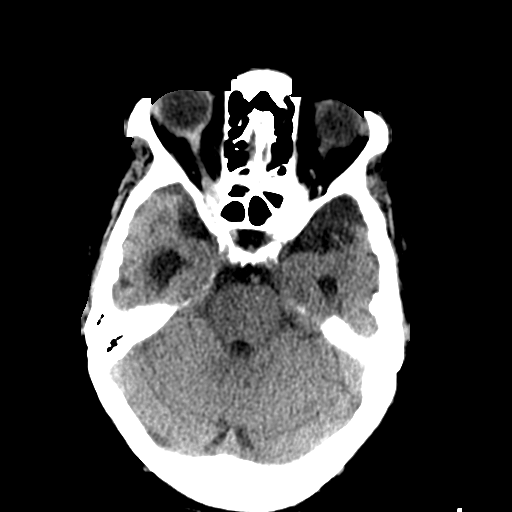
[im 13/30  brain]
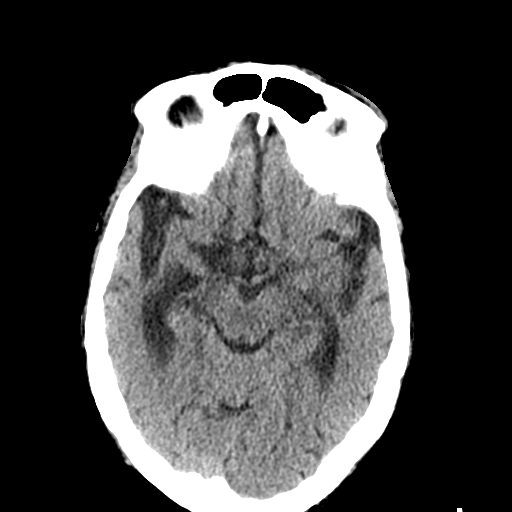
[im 13/30  bone]
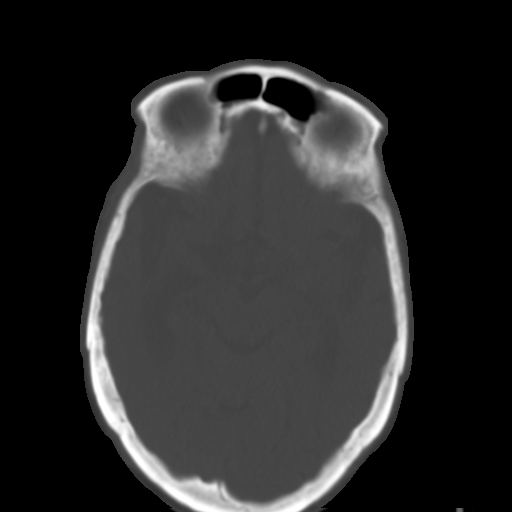
[im 16/30  brain]
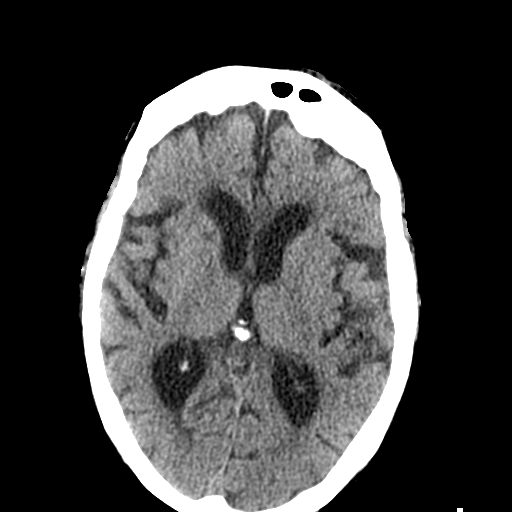
[im 18/30  brain]
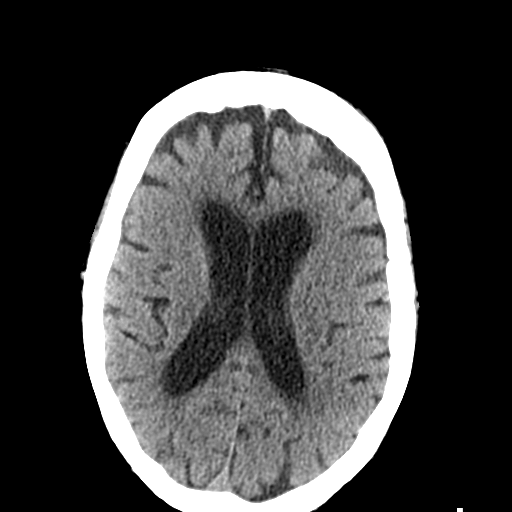
[im 21/30  brain]
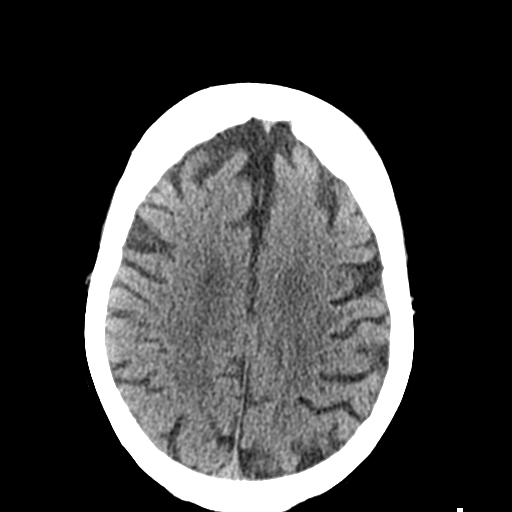
[im 23/30  brain]
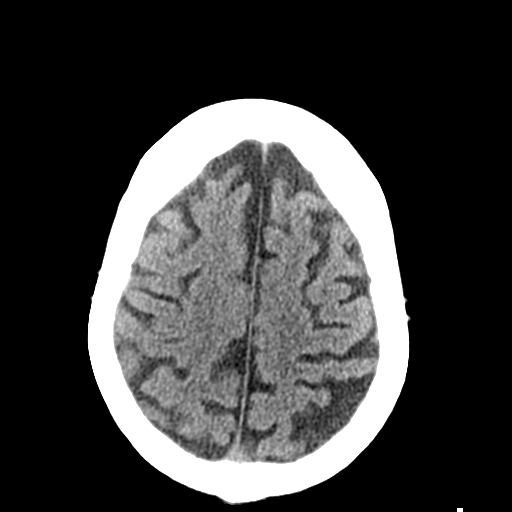
[im 23/30  bone]
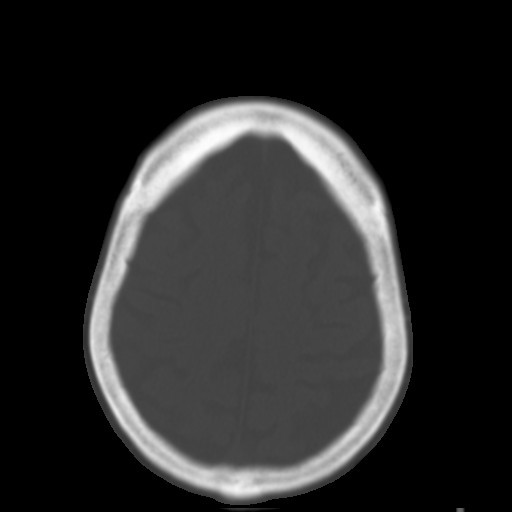
[im 26/30  brain]
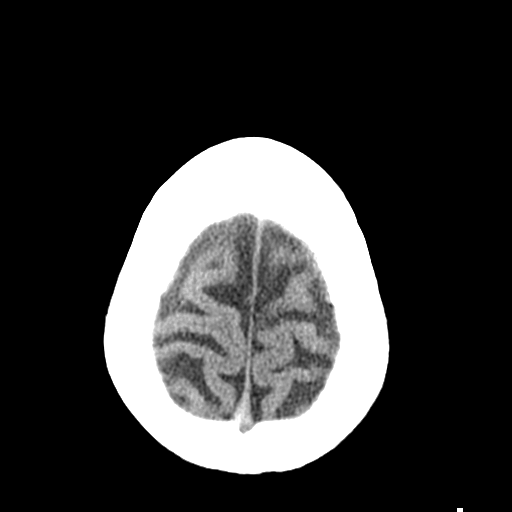
[im 28/30  brain]
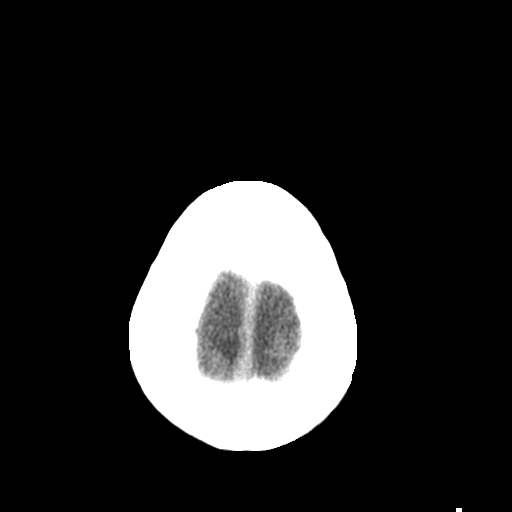

[Series 4: coronal soft · coronal · 0.31mm/px · 3 of 65 slices shown]
[im 13/65  brain]
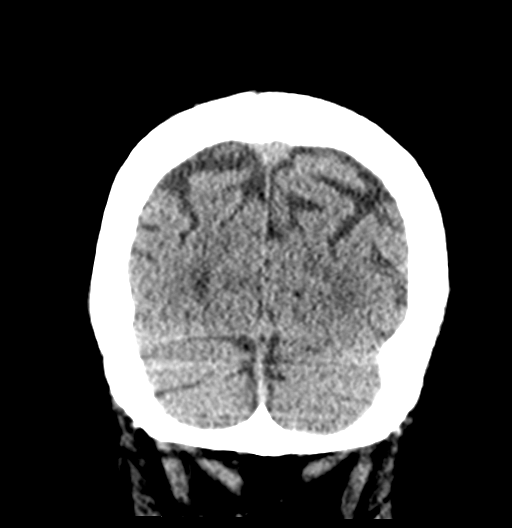
[im 26/65  brain]
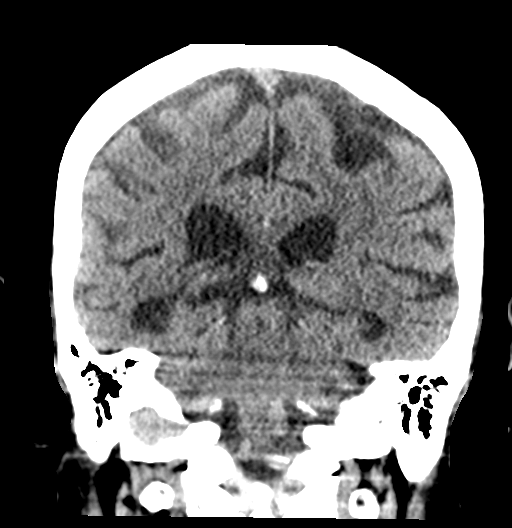
[im 39/65  brain]
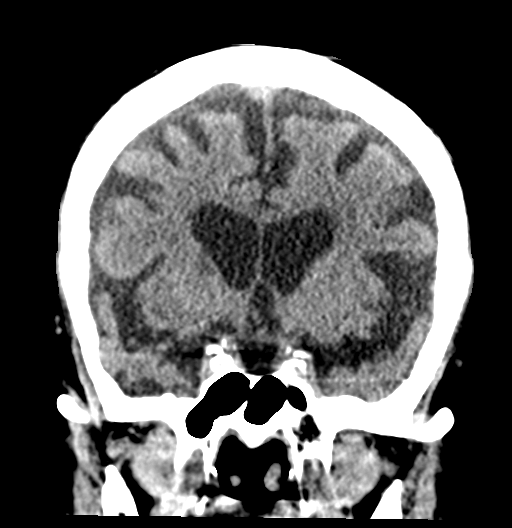

[Series 5: sagittal soft · sagittal · 0.32mm/px · 2 of 56 slices shown]
[im 19/56  brain]
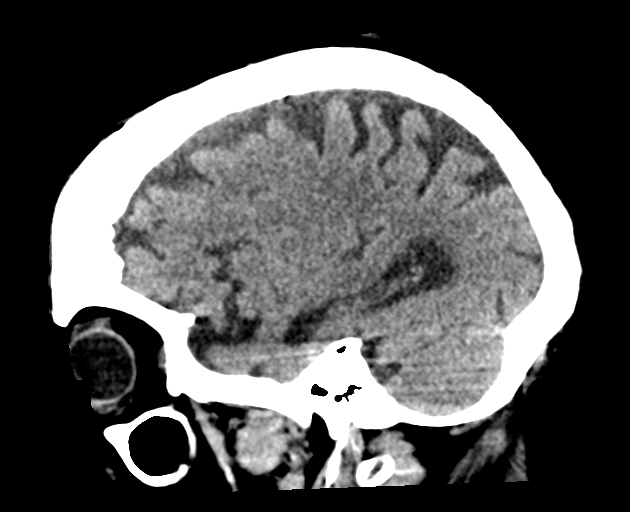
[im 37/56  brain]
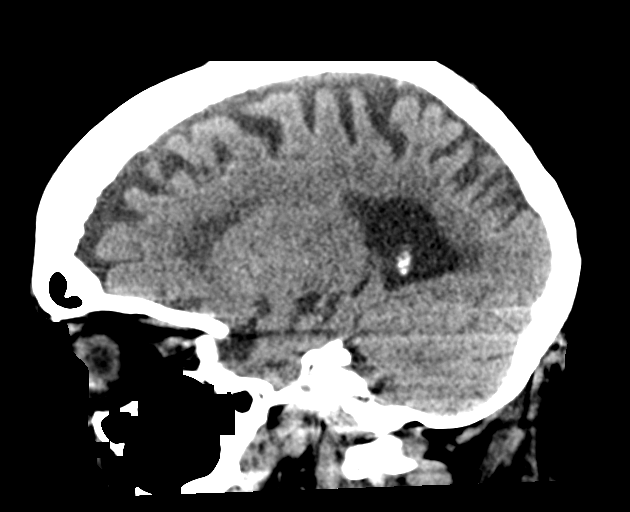

[16 of 47 positions shown; findings below may reference images not displayed]

FINDINGS: Brain: There is no evidence for acute hemorrhage, hydrocephalus,
mass lesion, or abnormal extra-axial fluid collection. No definite
CT evidence for acute infarction. Diffuse loss of parenchymal volume
is consistent with atrophy. Patchy low attenuation in the deep
hemispheric and periventricular white matter is nonspecific, but
likely reflects chronic microvascular ischemic demyelination. Age
indeterminate lacunar infarct noted right caudate head.

Vascular: No hyperdense vessel or unexpected calcification.

Skull: No evidence for fracture. No worrisome lytic or sclerotic
lesion.

Sinuses/Orbits: The visualized paranasal sinuses and mastoid air
cells are clear. Visualized portions of the globes and intraorbital
fat are unremarkable.

Other: None.
IMPRESSION: 1. No acute intracranial abnormality.
2. Atrophy with chronic small vessel white matter ischemic disease.
3. No skull fracture.

## 2024-04-28 DEATH — deceased
